# Patient Record
Sex: Female | Born: 1965 | Race: White | Hispanic: No | Marital: Married | Smoking: Former smoker
Health system: Southern US, Community
[De-identification: ages and names within clinical notes are randomized; demographics above are authoritative.]

## PROBLEM LIST (undated history)

## (undated) DIAGNOSIS — F329 Major depressive disorder, single episode, unspecified: Secondary | ICD-10-CM

## (undated) DIAGNOSIS — F32A Depression, unspecified: Secondary | ICD-10-CM

## (undated) DIAGNOSIS — E0500 Thyrotoxicosis with diffuse goiter without thyrotoxic crisis or storm: Secondary | ICD-10-CM

## (undated) DIAGNOSIS — F419 Anxiety disorder, unspecified: Secondary | ICD-10-CM

## (undated) HISTORY — PX: LITHOTRIPSY: SUR834

## (undated) HISTORY — PX: OTHER SURGICAL HISTORY: SHX169

## (undated) HISTORY — PX: FOOT SURGERY: SHX648

## (undated) HISTORY — DX: Thyrotoxicosis with diffuse goiter without thyrotoxic crisis or storm: E05.00

---

## 1998-04-10 ENCOUNTER — Encounter: Payer: Self-pay | Admitting: Endocrinology

## 1998-04-10 ENCOUNTER — Ambulatory Visit (HOSPITAL_COMMUNITY): Admission: RE | Admit: 1998-04-10 | Discharge: 1998-04-10 | Payer: Self-pay | Admitting: Endocrinology

## 1998-10-10 ENCOUNTER — Other Ambulatory Visit: Admission: RE | Admit: 1998-10-10 | Discharge: 1998-10-10 | Payer: Self-pay | Admitting: Gynecology

## 1999-11-07 ENCOUNTER — Other Ambulatory Visit: Admission: RE | Admit: 1999-11-07 | Discharge: 1999-11-07 | Payer: Self-pay | Admitting: Gynecology

## 2001-02-26 ENCOUNTER — Other Ambulatory Visit: Admission: RE | Admit: 2001-02-26 | Discharge: 2001-02-26 | Payer: Self-pay | Admitting: Gynecology

## 2002-03-26 ENCOUNTER — Other Ambulatory Visit: Admission: RE | Admit: 2002-03-26 | Discharge: 2002-03-26 | Payer: Self-pay | Admitting: Gynecology

## 2003-04-21 ENCOUNTER — Other Ambulatory Visit: Admission: RE | Admit: 2003-04-21 | Discharge: 2003-04-21 | Payer: Self-pay | Admitting: Gynecology

## 2004-10-23 ENCOUNTER — Other Ambulatory Visit: Admission: RE | Admit: 2004-10-23 | Discharge: 2004-10-23 | Payer: Self-pay | Admitting: Gynecology

## 2006-07-28 ENCOUNTER — Ambulatory Visit: Payer: Self-pay | Admitting: Internal Medicine

## 2007-04-01 ENCOUNTER — Ambulatory Visit: Payer: Self-pay | Admitting: Internal Medicine

## 2007-08-11 ENCOUNTER — Other Ambulatory Visit: Payer: Self-pay

## 2007-08-11 ENCOUNTER — Emergency Department: Payer: Self-pay | Admitting: Emergency Medicine

## 2008-09-26 ENCOUNTER — Other Ambulatory Visit: Admission: RE | Admit: 2008-09-26 | Discharge: 2008-09-26 | Payer: Self-pay | Admitting: Gynecology

## 2008-09-26 ENCOUNTER — Encounter: Payer: Self-pay | Admitting: Gynecology

## 2008-09-26 ENCOUNTER — Ambulatory Visit: Payer: Self-pay | Admitting: Gynecology

## 2008-10-14 DIAGNOSIS — D239 Other benign neoplasm of skin, unspecified: Secondary | ICD-10-CM

## 2008-10-14 HISTORY — DX: Other benign neoplasm of skin, unspecified: D23.9

## 2009-01-09 ENCOUNTER — Ambulatory Visit: Payer: Self-pay | Admitting: Unknown Physician Specialty

## 2009-01-22 HISTORY — PX: SHOULDER SURGERY: SHX246

## 2009-04-08 ENCOUNTER — Ambulatory Visit: Payer: Self-pay

## 2009-06-09 ENCOUNTER — Ambulatory Visit: Payer: Self-pay | Admitting: Pain Medicine

## 2009-08-03 ENCOUNTER — Ambulatory Visit: Payer: Self-pay | Admitting: Pain Medicine

## 2009-08-08 ENCOUNTER — Ambulatory Visit: Payer: Self-pay | Admitting: Pain Medicine

## 2009-08-14 ENCOUNTER — Ambulatory Visit: Payer: Self-pay | Admitting: Unknown Physician Specialty

## 2009-08-22 ENCOUNTER — Ambulatory Visit: Payer: Self-pay | Admitting: Pain Medicine

## 2009-09-13 ENCOUNTER — Ambulatory Visit: Payer: Self-pay | Admitting: Gynecology

## 2009-12-21 ENCOUNTER — Ambulatory Visit: Payer: Self-pay | Admitting: Gynecology

## 2009-12-21 ENCOUNTER — Other Ambulatory Visit: Admission: RE | Admit: 2009-12-21 | Discharge: 2009-12-21 | Payer: Self-pay | Admitting: Gynecology

## 2010-01-02 ENCOUNTER — Ambulatory Visit (HOSPITAL_COMMUNITY): Admission: RE | Admit: 2010-01-02 | Discharge: 2010-01-02 | Payer: Self-pay | Admitting: Gynecology

## 2010-01-08 ENCOUNTER — Ambulatory Visit: Payer: Self-pay | Admitting: Gynecology

## 2010-06-05 ENCOUNTER — Ambulatory Visit: Payer: Self-pay | Admitting: Internal Medicine

## 2010-08-29 ENCOUNTER — Ambulatory Visit: Payer: Self-pay | Admitting: Podiatry

## 2010-09-13 ENCOUNTER — Ambulatory Visit: Payer: Self-pay | Admitting: Podiatry

## 2011-03-14 ENCOUNTER — Ambulatory Visit: Payer: Self-pay

## 2011-03-27 ENCOUNTER — Other Ambulatory Visit: Payer: Self-pay | Admitting: Gynecology

## 2011-03-27 DIAGNOSIS — Z1231 Encounter for screening mammogram for malignant neoplasm of breast: Secondary | ICD-10-CM

## 2011-04-10 ENCOUNTER — Ambulatory Visit: Payer: Self-pay | Admitting: Podiatry

## 2011-04-25 ENCOUNTER — Ambulatory Visit (HOSPITAL_COMMUNITY): Payer: Self-pay

## 2011-04-25 ENCOUNTER — Encounter: Payer: Self-pay | Admitting: Gynecology

## 2011-05-27 ENCOUNTER — Ambulatory Visit: Payer: Self-pay | Admitting: Urology

## 2011-06-04 ENCOUNTER — Ambulatory Visit: Payer: Self-pay | Admitting: Podiatry

## 2011-12-03 ENCOUNTER — Ambulatory Visit: Payer: Self-pay | Admitting: Podiatry

## 2011-12-05 ENCOUNTER — Ambulatory Visit: Payer: Self-pay

## 2011-12-16 ENCOUNTER — Ambulatory Visit: Payer: Self-pay

## 2012-06-25 ENCOUNTER — Ambulatory Visit: Payer: Self-pay

## 2013-02-02 ENCOUNTER — Ambulatory Visit: Payer: Self-pay

## 2013-05-07 ENCOUNTER — Ambulatory Visit: Payer: Self-pay

## 2015-04-20 ENCOUNTER — Ambulatory Visit
Admission: RE | Admit: 2015-04-20 | Discharge: 2015-04-20 | Disposition: A | Discharge status: Home / Self Care | Payer: BLUE CROSS/BLUE SHIELD | Source: Ambulatory Visit | Attending: Nurse Practitioner | Admitting: Nurse Practitioner

## 2015-04-20 ENCOUNTER — Other Ambulatory Visit: Payer: Self-pay | Admitting: Nurse Practitioner

## 2015-04-20 DIAGNOSIS — M542 Cervicalgia: Secondary | ICD-10-CM | POA: Insufficient documentation

## 2015-04-20 DIAGNOSIS — M545 Low back pain: Secondary | ICD-10-CM

## 2015-04-20 DIAGNOSIS — M546 Pain in thoracic spine: Secondary | ICD-10-CM

## 2015-04-20 DIAGNOSIS — M549 Dorsalgia, unspecified: Secondary | ICD-10-CM | POA: Insufficient documentation

## 2015-09-29 DIAGNOSIS — F4323 Adjustment disorder with mixed anxiety and depressed mood: Secondary | ICD-10-CM | POA: Diagnosis not present

## 2015-10-04 DIAGNOSIS — F4323 Adjustment disorder with mixed anxiety and depressed mood: Secondary | ICD-10-CM | POA: Diagnosis not present

## 2015-10-05 DIAGNOSIS — M5412 Radiculopathy, cervical region: Secondary | ICD-10-CM | POA: Diagnosis not present

## 2015-10-05 DIAGNOSIS — M5416 Radiculopathy, lumbar region: Secondary | ICD-10-CM | POA: Diagnosis not present

## 2015-10-05 DIAGNOSIS — M5030 Other cervical disc degeneration, unspecified cervical region: Secondary | ICD-10-CM | POA: Diagnosis not present

## 2015-10-05 DIAGNOSIS — M5136 Other intervertebral disc degeneration, lumbar region: Secondary | ICD-10-CM | POA: Diagnosis not present

## 2015-10-09 ENCOUNTER — Other Ambulatory Visit: Payer: Self-pay | Admitting: Physical Medicine and Rehabilitation

## 2015-10-09 DIAGNOSIS — M5416 Radiculopathy, lumbar region: Secondary | ICD-10-CM

## 2015-10-10 ENCOUNTER — Ambulatory Visit
Admission: RE | Admit: 2015-10-10 | Discharge: 2015-10-10 | Disposition: A | Discharge status: Home / Self Care | Payer: BLUE CROSS/BLUE SHIELD | Source: Ambulatory Visit | Attending: Physical Medicine and Rehabilitation | Admitting: Physical Medicine and Rehabilitation

## 2015-10-10 DIAGNOSIS — M5416 Radiculopathy, lumbar region: Secondary | ICD-10-CM | POA: Insufficient documentation

## 2015-10-10 DIAGNOSIS — Z885 Allergy status to narcotic agent status: Secondary | ICD-10-CM | POA: Insufficient documentation

## 2015-10-10 DIAGNOSIS — M47816 Spondylosis without myelopathy or radiculopathy, lumbar region: Secondary | ICD-10-CM | POA: Diagnosis not present

## 2015-10-13 DIAGNOSIS — F4323 Adjustment disorder with mixed anxiety and depressed mood: Secondary | ICD-10-CM | POA: Diagnosis not present

## 2015-10-18 DIAGNOSIS — F4323 Adjustment disorder with mixed anxiety and depressed mood: Secondary | ICD-10-CM | POA: Diagnosis not present

## 2015-10-19 DIAGNOSIS — M5116 Intervertebral disc disorders with radiculopathy, lumbar region: Secondary | ICD-10-CM | POA: Diagnosis not present

## 2015-10-19 DIAGNOSIS — F329 Major depressive disorder, single episode, unspecified: Secondary | ICD-10-CM | POA: Diagnosis not present

## 2015-10-19 DIAGNOSIS — F411 Generalized anxiety disorder: Secondary | ICD-10-CM | POA: Diagnosis not present

## 2015-10-19 DIAGNOSIS — G4700 Insomnia, unspecified: Secondary | ICD-10-CM | POA: Diagnosis not present

## 2015-10-24 DIAGNOSIS — F4323 Adjustment disorder with mixed anxiety and depressed mood: Secondary | ICD-10-CM | POA: Diagnosis not present

## 2015-11-01 DIAGNOSIS — M5136 Other intervertebral disc degeneration, lumbar region: Secondary | ICD-10-CM | POA: Diagnosis not present

## 2015-11-01 DIAGNOSIS — M5416 Radiculopathy, lumbar region: Secondary | ICD-10-CM | POA: Diagnosis not present

## 2015-11-13 DIAGNOSIS — L400 Psoriasis vulgaris: Secondary | ICD-10-CM | POA: Diagnosis not present

## 2015-11-14 DIAGNOSIS — F4323 Adjustment disorder with mixed anxiety and depressed mood: Secondary | ICD-10-CM | POA: Diagnosis not present

## 2015-11-21 DIAGNOSIS — F4323 Adjustment disorder with mixed anxiety and depressed mood: Secondary | ICD-10-CM | POA: Diagnosis not present

## 2015-12-12 DIAGNOSIS — F4323 Adjustment disorder with mixed anxiety and depressed mood: Secondary | ICD-10-CM | POA: Diagnosis not present

## 2015-12-19 DIAGNOSIS — F4323 Adjustment disorder with mixed anxiety and depressed mood: Secondary | ICD-10-CM | POA: Diagnosis not present

## 2016-01-08 DIAGNOSIS — F4323 Adjustment disorder with mixed anxiety and depressed mood: Secondary | ICD-10-CM | POA: Diagnosis not present

## 2016-01-19 DIAGNOSIS — F4323 Adjustment disorder with mixed anxiety and depressed mood: Secondary | ICD-10-CM | POA: Diagnosis not present

## 2016-01-23 DIAGNOSIS — F4323 Adjustment disorder with mixed anxiety and depressed mood: Secondary | ICD-10-CM | POA: Diagnosis not present

## 2016-01-31 DIAGNOSIS — F4323 Adjustment disorder with mixed anxiety and depressed mood: Secondary | ICD-10-CM | POA: Diagnosis not present

## 2016-02-03 DIAGNOSIS — N390 Urinary tract infection, site not specified: Secondary | ICD-10-CM | POA: Diagnosis not present

## 2016-02-03 DIAGNOSIS — R399 Unspecified symptoms and signs involving the genitourinary system: Secondary | ICD-10-CM | POA: Diagnosis not present

## 2016-02-14 DIAGNOSIS — L400 Psoriasis vulgaris: Secondary | ICD-10-CM | POA: Diagnosis not present

## 2016-02-14 DIAGNOSIS — D485 Neoplasm of uncertain behavior of skin: Secondary | ICD-10-CM | POA: Diagnosis not present

## 2016-02-14 DIAGNOSIS — D229 Melanocytic nevi, unspecified: Secondary | ICD-10-CM | POA: Diagnosis not present

## 2016-02-14 DIAGNOSIS — Z1283 Encounter for screening for malignant neoplasm of skin: Secondary | ICD-10-CM | POA: Diagnosis not present

## 2016-02-14 DIAGNOSIS — F4323 Adjustment disorder with mixed anxiety and depressed mood: Secondary | ICD-10-CM | POA: Diagnosis not present

## 2016-02-15 DIAGNOSIS — F411 Generalized anxiety disorder: Secondary | ICD-10-CM | POA: Diagnosis not present

## 2016-02-15 DIAGNOSIS — F329 Major depressive disorder, single episode, unspecified: Secondary | ICD-10-CM | POA: Diagnosis not present

## 2016-02-15 DIAGNOSIS — G4700 Insomnia, unspecified: Secondary | ICD-10-CM | POA: Diagnosis not present

## 2016-02-15 DIAGNOSIS — N390 Urinary tract infection, site not specified: Secondary | ICD-10-CM | POA: Diagnosis not present

## 2016-02-27 DIAGNOSIS — F4323 Adjustment disorder with mixed anxiety and depressed mood: Secondary | ICD-10-CM | POA: Diagnosis not present

## 2016-03-08 DIAGNOSIS — F4323 Adjustment disorder with mixed anxiety and depressed mood: Secondary | ICD-10-CM | POA: Diagnosis not present

## 2016-03-22 DIAGNOSIS — L400 Psoriasis vulgaris: Secondary | ICD-10-CM | POA: Diagnosis not present

## 2016-03-25 DIAGNOSIS — F4323 Adjustment disorder with mixed anxiety and depressed mood: Secondary | ICD-10-CM | POA: Diagnosis not present

## 2016-04-10 DIAGNOSIS — F4323 Adjustment disorder with mixed anxiety and depressed mood: Secondary | ICD-10-CM | POA: Diagnosis not present

## 2016-04-18 DIAGNOSIS — Z23 Encounter for immunization: Secondary | ICD-10-CM | POA: Diagnosis not present

## 2016-05-03 DIAGNOSIS — F4323 Adjustment disorder with mixed anxiety and depressed mood: Secondary | ICD-10-CM | POA: Diagnosis not present

## 2016-05-12 DIAGNOSIS — K121 Other forms of stomatitis: Secondary | ICD-10-CM | POA: Diagnosis not present

## 2016-05-20 DIAGNOSIS — F4323 Adjustment disorder with mixed anxiety and depressed mood: Secondary | ICD-10-CM | POA: Diagnosis not present

## 2016-05-21 ENCOUNTER — Other Ambulatory Visit: Payer: Self-pay | Admitting: Internal Medicine

## 2016-05-21 DIAGNOSIS — Z1231 Encounter for screening mammogram for malignant neoplasm of breast: Secondary | ICD-10-CM

## 2016-05-21 DIAGNOSIS — Z124 Encounter for screening for malignant neoplasm of cervix: Secondary | ICD-10-CM | POA: Diagnosis not present

## 2016-05-21 DIAGNOSIS — F411 Generalized anxiety disorder: Secondary | ICD-10-CM | POA: Diagnosis not present

## 2016-05-21 DIAGNOSIS — M5117 Intervertebral disc disorders with radiculopathy, lumbosacral region: Secondary | ICD-10-CM | POA: Diagnosis not present

## 2016-05-21 DIAGNOSIS — L409 Psoriasis, unspecified: Secondary | ICD-10-CM | POA: Diagnosis not present

## 2016-05-21 DIAGNOSIS — Z0001 Encounter for general adult medical examination with abnormal findings: Secondary | ICD-10-CM | POA: Diagnosis not present

## 2016-05-23 DIAGNOSIS — M5416 Radiculopathy, lumbar region: Secondary | ICD-10-CM | POA: Diagnosis not present

## 2016-05-23 DIAGNOSIS — M5136 Other intervertebral disc degeneration, lumbar region: Secondary | ICD-10-CM | POA: Diagnosis not present

## 2016-06-11 DIAGNOSIS — F4323 Adjustment disorder with mixed anxiety and depressed mood: Secondary | ICD-10-CM | POA: Diagnosis not present

## 2016-06-27 ENCOUNTER — Ambulatory Visit
Admission: RE | Admit: 2016-06-27 | Discharge: 2016-06-27 | Disposition: A | Discharge status: Home / Self Care | Payer: BLUE CROSS/BLUE SHIELD | Source: Ambulatory Visit | Attending: Internal Medicine | Admitting: Internal Medicine

## 2016-06-27 DIAGNOSIS — Z1231 Encounter for screening mammogram for malignant neoplasm of breast: Secondary | ICD-10-CM

## 2016-06-27 DIAGNOSIS — R928 Other abnormal and inconclusive findings on diagnostic imaging of breast: Secondary | ICD-10-CM | POA: Diagnosis present

## 2016-06-28 ENCOUNTER — Other Ambulatory Visit: Payer: Self-pay | Admitting: Nurse Practitioner

## 2016-06-28 DIAGNOSIS — R928 Other abnormal and inconclusive findings on diagnostic imaging of breast: Secondary | ICD-10-CM

## 2016-06-28 DIAGNOSIS — N6489 Other specified disorders of breast: Secondary | ICD-10-CM

## 2016-07-04 ENCOUNTER — Ambulatory Visit
Admission: RE | Admit: 2016-07-04 | Discharge: 2016-07-04 | Disposition: A | Discharge status: Home / Self Care | Payer: BLUE CROSS/BLUE SHIELD | Source: Ambulatory Visit | Attending: Nurse Practitioner | Admitting: Nurse Practitioner

## 2016-07-04 DIAGNOSIS — R928 Other abnormal and inconclusive findings on diagnostic imaging of breast: Secondary | ICD-10-CM

## 2016-07-04 DIAGNOSIS — N6489 Other specified disorders of breast: Secondary | ICD-10-CM | POA: Insufficient documentation

## 2016-07-04 DIAGNOSIS — R922 Inconclusive mammogram: Secondary | ICD-10-CM | POA: Diagnosis not present

## 2016-07-15 DIAGNOSIS — F4323 Adjustment disorder with mixed anxiety and depressed mood: Secondary | ICD-10-CM | POA: Diagnosis not present

## 2016-07-30 DIAGNOSIS — F4322 Adjustment disorder with anxiety: Secondary | ICD-10-CM | POA: Diagnosis not present

## 2016-08-02 DIAGNOSIS — J111 Influenza due to unidentified influenza virus with other respiratory manifestations: Secondary | ICD-10-CM | POA: Diagnosis not present

## 2016-08-02 DIAGNOSIS — R05 Cough: Secondary | ICD-10-CM | POA: Diagnosis not present

## 2016-08-27 DIAGNOSIS — F4322 Adjustment disorder with anxiety: Secondary | ICD-10-CM | POA: Diagnosis not present

## 2016-09-17 DIAGNOSIS — L299 Pruritus, unspecified: Secondary | ICD-10-CM | POA: Diagnosis not present

## 2016-09-17 DIAGNOSIS — L2089 Other atopic dermatitis: Secondary | ICD-10-CM | POA: Diagnosis not present

## 2016-09-17 DIAGNOSIS — L400 Psoriasis vulgaris: Secondary | ICD-10-CM | POA: Diagnosis not present

## 2016-09-17 DIAGNOSIS — L821 Other seborrheic keratosis: Secondary | ICD-10-CM | POA: Diagnosis not present

## 2016-09-19 DIAGNOSIS — B0230 Zoster ocular disease, unspecified: Secondary | ICD-10-CM | POA: Diagnosis not present

## 2016-09-19 DIAGNOSIS — N390 Urinary tract infection, site not specified: Secondary | ICD-10-CM | POA: Diagnosis not present

## 2016-09-19 DIAGNOSIS — L298 Other pruritus: Secondary | ICD-10-CM | POA: Diagnosis not present

## 2016-09-19 DIAGNOSIS — R300 Dysuria: Secondary | ICD-10-CM | POA: Diagnosis not present

## 2016-09-19 DIAGNOSIS — N771 Vaginitis, vulvitis and vulvovaginitis in diseases classified elsewhere: Secondary | ICD-10-CM | POA: Diagnosis not present

## 2016-10-01 DIAGNOSIS — F4322 Adjustment disorder with anxiety: Secondary | ICD-10-CM | POA: Diagnosis not present

## 2017-01-14 DIAGNOSIS — L400 Psoriasis vulgaris: Secondary | ICD-10-CM | POA: Diagnosis not present

## 2017-03-05 DIAGNOSIS — G4700 Insomnia, unspecified: Secondary | ICD-10-CM | POA: Diagnosis not present

## 2017-03-05 DIAGNOSIS — M50122 Cervical disc disorder at C5-C6 level with radiculopathy: Secondary | ICD-10-CM | POA: Diagnosis not present

## 2017-03-05 DIAGNOSIS — M064 Inflammatory polyarthropathy: Secondary | ICD-10-CM | POA: Diagnosis not present

## 2017-03-05 DIAGNOSIS — R10816 Epigastric abdominal tenderness: Secondary | ICD-10-CM | POA: Diagnosis not present

## 2017-03-06 ENCOUNTER — Other Ambulatory Visit: Payer: Self-pay | Admitting: Nurse Practitioner

## 2017-03-06 ENCOUNTER — Ambulatory Visit
Admission: RE | Admit: 2017-03-06 | Discharge: 2017-03-06 | Disposition: A | Discharge status: Home / Self Care | Payer: BLUE CROSS/BLUE SHIELD | Source: Ambulatory Visit | Attending: Nurse Practitioner | Admitting: Nurse Practitioner

## 2017-03-06 DIAGNOSIS — M542 Cervicalgia: Secondary | ICD-10-CM | POA: Insufficient documentation

## 2017-03-06 DIAGNOSIS — M069 Rheumatoid arthritis, unspecified: Secondary | ICD-10-CM | POA: Diagnosis not present

## 2017-03-06 DIAGNOSIS — E039 Hypothyroidism, unspecified: Secondary | ICD-10-CM | POA: Diagnosis not present

## 2017-03-06 DIAGNOSIS — R52 Pain, unspecified: Secondary | ICD-10-CM

## 2017-03-06 DIAGNOSIS — Z0001 Encounter for general adult medical examination with abnormal findings: Secondary | ICD-10-CM | POA: Diagnosis not present

## 2017-03-06 DIAGNOSIS — R10816 Epigastric abdominal tenderness: Secondary | ICD-10-CM | POA: Diagnosis not present

## 2017-03-06 DIAGNOSIS — M4802 Spinal stenosis, cervical region: Secondary | ICD-10-CM | POA: Diagnosis not present

## 2017-03-18 DIAGNOSIS — L400 Psoriasis vulgaris: Secondary | ICD-10-CM | POA: Diagnosis not present

## 2017-03-18 DIAGNOSIS — D229 Melanocytic nevi, unspecified: Secondary | ICD-10-CM | POA: Diagnosis not present

## 2017-03-18 DIAGNOSIS — Z1283 Encounter for screening for malignant neoplasm of skin: Secondary | ICD-10-CM | POA: Diagnosis not present

## 2017-03-18 DIAGNOSIS — D485 Neoplasm of uncertain behavior of skin: Secondary | ICD-10-CM | POA: Diagnosis not present

## 2017-03-18 DIAGNOSIS — L570 Actinic keratosis: Secondary | ICD-10-CM | POA: Diagnosis not present

## 2017-03-24 DIAGNOSIS — R10816 Epigastric abdominal tenderness: Secondary | ICD-10-CM | POA: Diagnosis not present

## 2017-03-27 DIAGNOSIS — J069 Acute upper respiratory infection, unspecified: Secondary | ICD-10-CM | POA: Diagnosis not present

## 2017-04-11 DIAGNOSIS — F329 Major depressive disorder, single episode, unspecified: Secondary | ICD-10-CM | POA: Diagnosis not present

## 2017-04-11 DIAGNOSIS — M064 Inflammatory polyarthropathy: Secondary | ICD-10-CM | POA: Diagnosis not present

## 2017-04-11 DIAGNOSIS — M50122 Cervical disc disorder at C5-C6 level with radiculopathy: Secondary | ICD-10-CM | POA: Diagnosis not present

## 2017-04-11 DIAGNOSIS — R10816 Epigastric abdominal tenderness: Secondary | ICD-10-CM | POA: Diagnosis not present

## 2017-06-26 DIAGNOSIS — M5116 Intervertebral disc disorders with radiculopathy, lumbar region: Secondary | ICD-10-CM | POA: Diagnosis not present

## 2017-06-26 DIAGNOSIS — L409 Psoriasis, unspecified: Secondary | ICD-10-CM | POA: Diagnosis not present

## 2017-06-26 DIAGNOSIS — E049 Nontoxic goiter, unspecified: Secondary | ICD-10-CM | POA: Insufficient documentation

## 2017-06-26 DIAGNOSIS — M2550 Pain in unspecified joint: Secondary | ICD-10-CM | POA: Diagnosis not present

## 2017-06-26 DIAGNOSIS — M50122 Cervical disc disorder at C5-C6 level with radiculopathy: Secondary | ICD-10-CM | POA: Diagnosis not present

## 2017-06-26 DIAGNOSIS — Z1231 Encounter for screening mammogram for malignant neoplasm of breast: Secondary | ICD-10-CM | POA: Insufficient documentation

## 2017-06-26 DIAGNOSIS — M130 Polyarthritis, unspecified: Secondary | ICD-10-CM | POA: Diagnosis not present

## 2017-06-26 DIAGNOSIS — Z79899 Other long term (current) drug therapy: Secondary | ICD-10-CM | POA: Insufficient documentation

## 2017-06-26 DIAGNOSIS — Z13820 Encounter for screening for osteoporosis: Secondary | ICD-10-CM | POA: Diagnosis not present

## 2017-07-03 ENCOUNTER — Other Ambulatory Visit: Payer: Self-pay

## 2017-07-04 ENCOUNTER — Other Ambulatory Visit: Payer: Self-pay | Admitting: Nurse Practitioner

## 2017-07-04 DIAGNOSIS — F5101 Primary insomnia: Secondary | ICD-10-CM

## 2017-07-04 MED ORDER — ZOLPIDEM TARTRATE 10 MG PO TABS: 10.0000 mg | ORAL_TABLET | Freq: Every evening | ORAL | 2 refills | Status: DC | PRN

## 2017-07-04 NOTE — Progress Notes (Signed)
Sent in new rx for [facility]ien [facility] with 2 refills

## 2017-07-21 DIAGNOSIS — M9902 Segmental and somatic dysfunction of thoracic region: Secondary | ICD-10-CM | POA: Diagnosis not present

## 2017-07-21 DIAGNOSIS — M546 Pain in thoracic spine: Secondary | ICD-10-CM | POA: Diagnosis not present

## 2017-07-21 DIAGNOSIS — M9901 Segmental and somatic dysfunction of cervical region: Secondary | ICD-10-CM | POA: Diagnosis not present

## 2017-07-21 DIAGNOSIS — M9903 Segmental and somatic dysfunction of lumbar region: Secondary | ICD-10-CM | POA: Diagnosis not present

## 2017-07-29 ENCOUNTER — Ambulatory Visit: Payer: BLUE CROSS/BLUE SHIELD | Admitting: Nurse Practitioner

## 2017-07-29 ENCOUNTER — Encounter: Payer: Self-pay | Admitting: Nurse Practitioner

## 2017-07-29 VITALS — BP 98/67 | HR 71 | Resp 16 | Ht 64.0 in | Wt 150.0 lb

## 2017-07-29 DIAGNOSIS — F5101 Primary insomnia: Secondary | ICD-10-CM

## 2017-07-29 DIAGNOSIS — Z1231 Encounter for screening mammogram for malignant neoplasm of breast: Secondary | ICD-10-CM | POA: Diagnosis not present

## 2017-07-29 DIAGNOSIS — B372 Candidiasis of skin and nail: Secondary | ICD-10-CM

## 2017-07-29 DIAGNOSIS — Z1239 Encounter for other screening for malignant neoplasm of breast: Secondary | ICD-10-CM

## 2017-07-29 MED ORDER — ZOLPIDEM TARTRATE 10 MG PO TABS
10.0000 mg | ORAL_TABLET | Freq: Every evening | ORAL | 3 refills | Status: DC | PRN
Start: 2017-07-29 — End: 2017-11-28

## 2017-07-29 MED ORDER — CLOTRIMAZOLE-BETAMETHASONE 1-0.05 % EX CREA: 1.0000 "application " | TOPICAL_CREAM | Freq: Two times a day (BID) | CUTANEOUS | 1 refills | Status: DC

## 2017-07-29 NOTE — Progress Notes (Addendum)
[facility] [place], [place] [address]  Internal MEDICINE  Office Visit Note  Patient Name: Holly Le  [id number]  [id number]  Date of Service: 07/29/2017  Chief Complaint  Patient presents with  . Rash    burning in rectal area going on for past few days.    Rash  This is a new problem. The current episode started 1 to 4 weeks ago. The problem has been gradually worsening since onset. Location: around rectum. The rash is characterized by burning and redness. She was exposed to nothing. Pertinent negatives include no congestion, cough, diarrhea, fatigue, rhinorrhea, shortness of breath, sore throat or vomiting. Past treatments include anti-itch cream and cold compress. The treatment provided no relief.    Pt is here for routine follow up.    Current Medication: Outpatient Encounter Medications as of 07/29/2017  Medication Sig  . ALPRAZolam (XANAX) 0.5 MG tablet Take by mouth.  . Apremilast 30 MG TABS Take by mouth.  . citalopram (CELEXA) 10 MG tablet Take 2 tab po daily  . tiZANidine (ZANAFLEX) 4 MG tablet Take by mouth.  . clotrimazole-betamethasone (LOTRISONE) cream Apply 1 application topically 2 (two) times daily.  Marland Kitchen zolpidem (AMBIEN) 10 MG tablet Take 1 tablet (10 mg total) by mouth at bedtime as needed for sleep.  . [DISCONTINUED] zolpidem (AMBIEN) 10 MG tablet Take 1 tablet (10 mg total) by mouth at bedtime as needed for sleep.   No facility-administered encounter medications on file as of 07/29/2017.     Surgical History: Past Surgical History:  Procedure Laterality Date  . CESAREAN SECTION     twins  . LITHOTRIPSY    . SHOULDER SURGERY  01/2009   RIGHT    Medical History: Past Medical History:  Diagnosis Date  . Grave's disease     Family History: Family History  Problem Relation Age of Onset  . Lymphoma Father   . Lymphoma Paternal Aunt   . Cancer Maternal Grandfather        COLON    Social History   Socioeconomic  History  . Marital status: Married    Spouse name: Not on file  . Number of children: Not on file  . Years of education: Not on file  . Highest education level: Not on file  Social Needs  . Financial resource strain: Not on file  . Food insecurity - worry: Not on file  . Food insecurity - inability: Not on file  . Transportation needs - medical: Not on file  . Transportation needs - non-medical: Not on file  Occupational History  . Not on file  Tobacco Use  . Smoking status: Never Smoker  . Smokeless tobacco: Never Used  Substance and Sexual Activity  . Alcohol use: Yes  . Drug use: No  . Sexual activity: Not on file  Other Topics Concern  . Not on file  Social History Narrative  . Not on file      Review of Systems  Constitutional: Negative for chills, fatigue and unexpected weight change.  HENT: Negative for congestion, postnasal drip, rhinorrhea, sneezing and sore throat.   Eyes: Negative for redness.  Respiratory: Negative for cough, chest tightness, shortness of breath and wheezing.   Cardiovascular: Negative for chest pain and palpitations.  Gastrointestinal: Positive for constipation. Negative for abdominal pain, diarrhea, nausea and vomiting.  Endocrine: Negative for cold intolerance, heat intolerance, polydipsia, polyphagia and polyuria.  Genitourinary: Negative for dysuria and frequency.  Has frequent yeast infection.  Musculoskeletal: Positive for arthralgias. Negative for back pain, joint swelling and neck pain.  Skin: Positive for rash.       Rash is around rectal area. Patient states that this " is lighting her up."  Neurological: Negative for tremors, numbness and headaches.  Hematological: Negative for adenopathy. Does not bruise/bleed easily.  Psychiatric/Behavioral: Positive for sleep disturbance. Negative for behavioral problems (Depression) and suicidal ideas. The patient is not nervous/anxious.        Takes zolpidem 10mg  at night when needed for  insomnia. She does need to have refill today    Today's Vitals   07/29/17 0857  BP: 98/67  Pulse: 71  Resp: 16  SpO2: 96%  Weight: 150 lb (68 kg)  Height: 5\' 4"  (1.626 m)    Physical Exam  Constitutional: She is oriented to person, place, and time. She appears well-developed and well-nourished. No distress.  HENT:  Head: Normocephalic and atraumatic.  Mouth/Throat: Oropharynx is clear and moist. No oropharyngeal exudate.  Eyes: EOM are normal. Pupils are equal, round, and reactive to light.  Neck: Normal range of motion. Neck supple. No JVD present. No tracheal deviation present. No thyromegaly present.  Cardiovascular: Normal rate, regular rhythm and normal heart sounds. Exam reveals no gallop and no friction rub.  No murmur heard. Pulmonary/Chest: Effort normal and breath sounds normal. No respiratory distress. She has no wheezes. She has no rales. She exhibits no tenderness.  Abdominal: Soft. Bowel sounds are normal. There is no tenderness.  Genitourinary:     Musculoskeletal: Normal range of motion.  Lymphadenopathy:    She has no cervical adenopathy.  Neurological: She is alert and oriented to person, place, and time. No cranial nerve deficit.  Skin: Skin is warm and dry. She is not diaphoretic.  Psychiatric: She has a normal mood and affect. Her behavior is normal. Judgment and thought content normal.  Nursing note and vitals reviewed.   Assessment/Plan: 1. Primary insomnia Stable. May continue zolpidem as needed and as prescribed.  - zolpidem (AMBIEN) 10 MG tablet; Take 1 tablet (10 mg total) by mouth at bedtime as needed for sleep.  Dispense: 30 tablet; Refill: 3  2. Candidiasis of skin Directly around rectum/anus. - clotrimazole-betamethasone (LOTRISONE) cream; Apply 1 application topically 2 (two) times daily.  Dispense: 45 g; Refill: 1  3. Screening for breast cancer - MM DIGITAL SCREENING BILATERAL; Future  General Counseling: olivette beckmann  understanding of the findings of todays visit and agrees with plan of treatment. I have discussed any further diagnostic evaluation that may be needed or ordered today. We also reviewed her medications today. she has been encouraged to call the office with any questions or concerns that should arise related to todays visit.  This patient was seen by Leretha Pol, FNP- C in Collaboration with Dr Lavera Guise as a part of collaborative care agreement      Time spent: 36 Minutes          Dr Lavera Guise Internal medicine

## 2017-08-12 ENCOUNTER — Ambulatory Visit: Payer: BLUE CROSS/BLUE SHIELD | Attending: Nurse Practitioner

## 2017-09-15 DIAGNOSIS — L400 Psoriasis vulgaris: Secondary | ICD-10-CM | POA: Diagnosis not present

## 2017-09-15 DIAGNOSIS — L858 Other specified epidermal thickening: Secondary | ICD-10-CM | POA: Diagnosis not present

## 2017-09-15 DIAGNOSIS — L739 Follicular disorder, unspecified: Secondary | ICD-10-CM | POA: Diagnosis not present

## 2017-11-28 ENCOUNTER — Ambulatory Visit (INDEPENDENT_AMBULATORY_CARE_PROVIDER_SITE_OTHER): Payer: BLUE CROSS/BLUE SHIELD | Admitting: Nurse Practitioner

## 2017-11-28 ENCOUNTER — Encounter: Payer: Self-pay | Admitting: Nurse Practitioner

## 2017-11-28 VITALS — BP 98/70 | HR 64 | Resp 16 | Ht 64.0 in | Wt 146.6 lb

## 2017-11-28 DIAGNOSIS — R300 Dysuria: Secondary | ICD-10-CM

## 2017-11-28 DIAGNOSIS — Z1231 Encounter for screening mammogram for malignant neoplasm of breast: Secondary | ICD-10-CM | POA: Diagnosis not present

## 2017-11-28 DIAGNOSIS — F5101 Primary insomnia: Secondary | ICD-10-CM

## 2017-11-28 DIAGNOSIS — B379 Candidiasis, unspecified: Secondary | ICD-10-CM

## 2017-11-28 DIAGNOSIS — Z1239 Encounter for other screening for malignant neoplasm of breast: Secondary | ICD-10-CM

## 2017-11-28 DIAGNOSIS — Z0001 Encounter for general adult medical examination with abnormal findings: Secondary | ICD-10-CM | POA: Diagnosis not present

## 2017-11-28 MED ORDER — ZOLPIDEM TARTRATE 10 MG PO TABS: 10.0000 mg | ORAL_TABLET | Freq: Every evening | ORAL | 3 refills | Status: DC | PRN

## 2017-11-28 MED ORDER — FLUCONAZOLE 150 MG PO TABS: ORAL_TABLET | ORAL | 1 refills | Status: DC

## 2017-11-28 NOTE — Progress Notes (Signed)
[facility] [place], [place] [address]  Internal MEDICINE  Office Visit Note  Patient Name: Holly Le  [id number]  [id number]  Date of Service: 12/17/2017   Pt is here for routine health maintenance examination.  Chief Complaint  Patient presents with  . Annual Exam  . Vaginitis    prescribed creams not helping     The patient is c/o vaginal discharge. This is white in color. Has no odor. Having vaginal itching and irritation. Has been using OTC preparations to treat yeast infection without relief.     Current Medication: Outpatient Encounter Medications as of 11/28/2017  Medication Sig  . ALPRAZolam (XANAX) 0.5 MG tablet Take by mouth.  . Apremilast 30 MG TABS Take by mouth.  . citalopram (CELEXA) 10 MG tablet Take 2 tab po daily  . clotrimazole-betamethasone (LOTRISONE) cream Apply 1 application topically 2 (two) times daily.  Marland Kitchen tiZANidine (ZANAFLEX) 4 MG tablet Take by mouth.  . zolpidem (AMBIEN) 10 MG tablet Take 1 tablet (10 mg total) by mouth at bedtime as needed for sleep.  . [DISCONTINUED] zolpidem (AMBIEN) 10 MG tablet Take 1 tablet (10 mg total) by mouth at bedtime as needed for sleep.  . fluconazole (DIFLUCAN) 150 MG tablet Take 1 tablet po once. May repeat dose in 3 days as needed for persistent symptoms.   No facility-administered encounter medications on file as of 11/28/2017.     Surgical History: Past Surgical History:  Procedure Laterality Date  . CESAREAN SECTION     twins  . LITHOTRIPSY    . SHOULDER SURGERY  01/2009   RIGHT    Medical History: Past Medical History:  Diagnosis Date  . Grave's disease     Family History: Family History  Problem Relation Age of Onset  . Lymphoma Father   . Lymphoma Paternal Aunt   . Cancer Maternal Grandfather        COLON      Review of Systems  Constitutional: Negative for chills, fatigue and unexpected weight change.  HENT: Negative for congestion, postnasal drip,  rhinorrhea, sneezing and sore throat.   Eyes: Negative.  Negative for redness.  Respiratory: Negative for cough, chest tightness, shortness of breath and wheezing.   Cardiovascular: Negative for chest pain and palpitations.  Gastrointestinal: Positive for constipation. Negative for abdominal pain, diarrhea, nausea and vomiting.  Endocrine: Negative for cold intolerance, heat intolerance, polydipsia, polyphagia and polyuria.  Genitourinary: Negative for dysuria and frequency.       Has frequent yeast infection. Has been using OTC preparations and has had no relief.   Musculoskeletal: Positive for arthralgias. Negative for back pain, joint swelling and neck pain.  Skin: Positive for rash.       Rash is around rectal area. Patient states that this " is lighting her up."  Allergic/Immunologic: Positive for environmental allergies.  Neurological: Negative for dizziness, tremors, numbness and headaches.  Hematological: Negative for adenopathy. Does not bruise/bleed easily.  Psychiatric/Behavioral: Positive for sleep disturbance. Negative for behavioral problems (Depression) and suicidal ideas. The patient is not nervous/anxious.        Takes zolpidem 10mg  at night when needed for insomnia. She does need to have refill today     Today's Vitals   11/28/17 1004  BP: 98/70  Pulse: 64  Resp: 16  SpO2: 99%  Weight: 146 lb 9.6 oz (66.5 kg)  Height: 5\' 4"  (1.626 m)     Physical Exam  Constitutional: She is oriented to person, place,  and time. She appears well-developed and well-nourished. No distress.  HENT:  Head: Normocephalic and atraumatic.  Nose: Nose normal.  Mouth/Throat: Oropharynx is clear and moist. No oropharyngeal exudate.  Eyes: Pupils are equal, round, and reactive to light. Conjunctivae and EOM are normal.  Neck: Normal range of motion. Neck supple. No JVD present. Carotid bruit is not present. No tracheal deviation present. No thyromegaly present.  Cardiovascular: Normal rate,  regular rhythm, normal heart sounds and intact distal pulses. Exam reveals no gallop and no friction rub.  No murmur heard. Pulmonary/Chest: Effort normal and breath sounds normal. No respiratory distress. She has no wheezes. She has no rales. She exhibits no tenderness. Right breast exhibits no inverted nipple, no mass, no nipple discharge, no skin change and no tenderness. Left breast exhibits no inverted nipple, no mass, no nipple discharge, no skin change and no tenderness.  Abdominal: Soft. Bowel sounds are normal. There is no tenderness.  Musculoskeletal: Normal range of motion.  Lymphadenopathy:    She has no cervical adenopathy.  Neurological: She is alert and oriented to person, place, and time. No cranial nerve deficit.  Skin: Skin is warm and dry. She is not diaphoretic.  Psychiatric: She has a normal mood and affect. Her behavior is normal. Judgment and thought content normal.  Nursing note and vitals reviewed.  Assessment/Plan: 1. Encounter for general adult medical examination with abnormal findings Annual wellness visit today.   2. Candidosis Add back diflucan 150mg  tablets. Take once then repeat in three days as needed for persistent symptoms.  - fluconazole (DIFLUCAN) 150 MG tablet; Take 1 tablet po once. May repeat dose in 3 days as needed for persistent symptoms.  Dispense: 3 tablet; Refill: 1  3. Primary insomnia Continue zolpidem 10mg  at bedtime as needed. New prescription sent today.  - zolpidem (AMBIEN) 10 MG tablet; Take 1 tablet (10 mg total) by mouth at bedtime as needed for sleep.  Dispense: 30 tablet; Refill: 3  4. Dysuria - Urinalysis, Routine w reflex microscopic  5. Screening for breast cancer - MM DIGITAL SCREENING BILATERAL; Future  General Counseling: meliza kage understanding of the findings of todays visit and agrees with plan of treatment. I have discussed any further diagnostic evaluation that may be needed or ordered today. We also reviewed  her medications today. she has been encouraged to call the office with any questions or concerns that should arise related to todays visit.    Counseling:  This patient was seen by Leretha Pol, FNP- C in Collaboration with Dr Lavera Guise as a part of collaborative care agreement  Orders Placed This Encounter  Procedures  . MM DIGITAL SCREENING BILATERAL  . Urinalysis, Routine w reflex microscopic    Meds ordered this encounter  Medications  . zolpidem (AMBIEN) 10 MG tablet    Sig: Take 1 tablet (10 mg total) by mouth at bedtime as needed for sleep.    Dispense:  30 tablet    Refill:  3    Order Specific Question:   Supervising Provider    Answer:   Lavera Guise [[id number]]  . fluconazole (DIFLUCAN) 150 MG tablet    Sig: Take 1 tablet po once. May repeat dose in 3 days as needed for persistent symptoms.    Dispense:  3 tablet    Refill:  1    Order Specific Question:   Supervising Provider    Answer:   Lavera Guise [[id number]]    Time spent: 30 Minutes  Lavera Guise, MD  Internal Medicine

## 2017-11-29 LAB — URINALYSIS, ROUTINE W REFLEX MICROSCOPIC
BILIRUBIN UA: NEGATIVE
Glucose, UA: NEGATIVE
Ketones, UA: NEGATIVE
Leukocytes, UA: NEGATIVE
NITRITE UA: NEGATIVE
PH UA: 5.5 (ref 5.0–7.5)
PROTEIN UA: NEGATIVE
RBC UA: NEGATIVE
Specific Gravity, UA: 1.012 (ref 1.005–1.030)
UUROB: 0.2 mg/dL (ref 0.2–1.0)

## 2017-12-17 ENCOUNTER — Other Ambulatory Visit: Payer: Self-pay | Admitting: Internal Medicine

## 2017-12-17 DIAGNOSIS — F5101 Primary insomnia: Secondary | ICD-10-CM | POA: Insufficient documentation

## 2017-12-17 DIAGNOSIS — R300 Dysuria: Secondary | ICD-10-CM | POA: Insufficient documentation

## 2017-12-17 DIAGNOSIS — B379 Candidiasis, unspecified: Secondary | ICD-10-CM | POA: Insufficient documentation

## 2018-01-13 IMAGING — MR MR LUMBAR SPINE W/O CM
4 of 5 series · 15 of 48 positions shown · non-contrast
Comparison: 04/20/2015 plain film examination.  10/24/2009 MR.

CLINICAL DATA: 49-year-old female with low back pain extending to
right buttock. No leg pain and numbness. No known injury. Symptoms
for years worse over the past 6 months. Subsequent encounter.

EXAM:
MRI LUMBAR SPINE WITHOUT CONTRAST
TECHNIQUE: Multiplanar, multisequence MR imaging of the lumbar spine was
performed. No intravenous contrast was administered.

[Series 3: T2 · sagittal · 4.0mm · 0.44mm/px · 6 of 17 slices shown (1 of 2)]
[im 1/17]
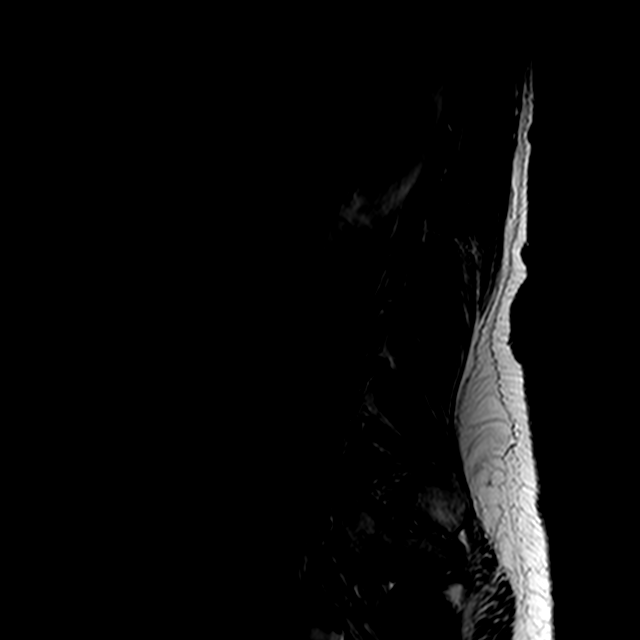
[im 3/17]
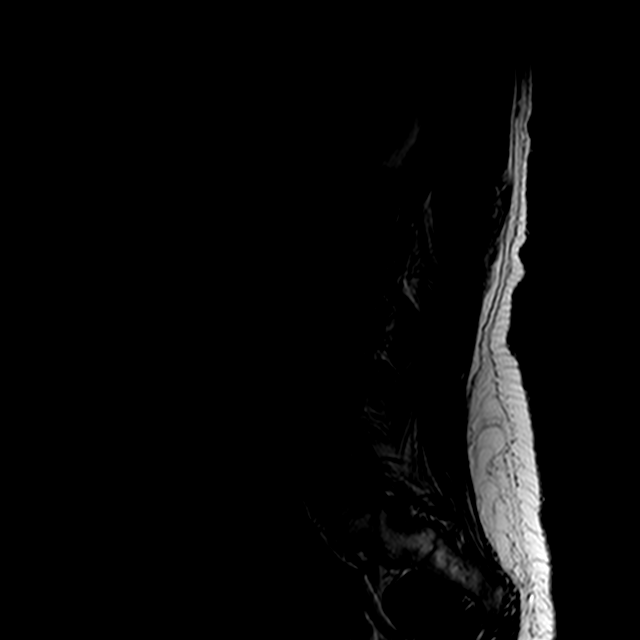
[im 6/17]
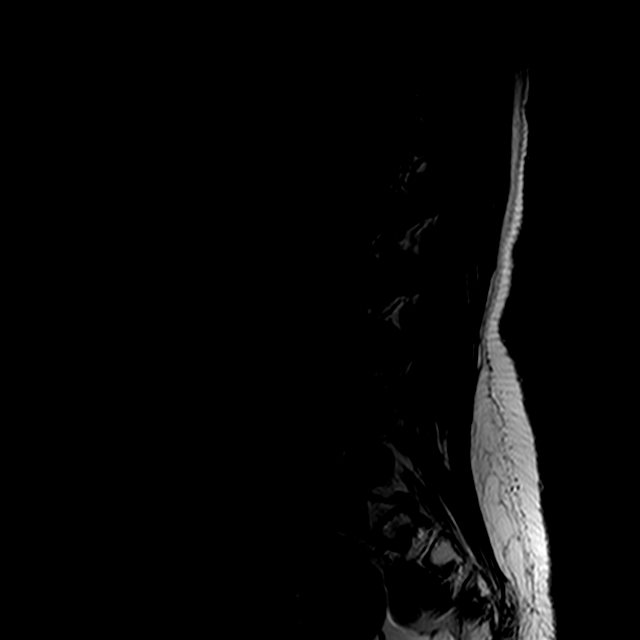
[im 9/17]
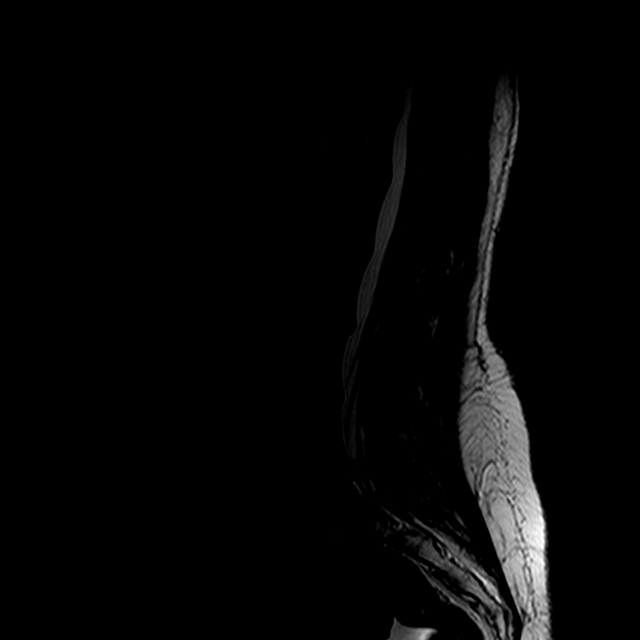
[im 11/17]
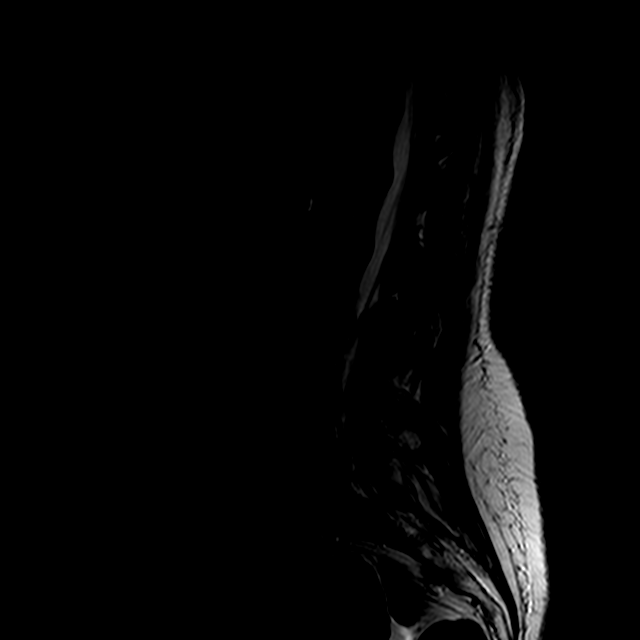
[im 14/17]
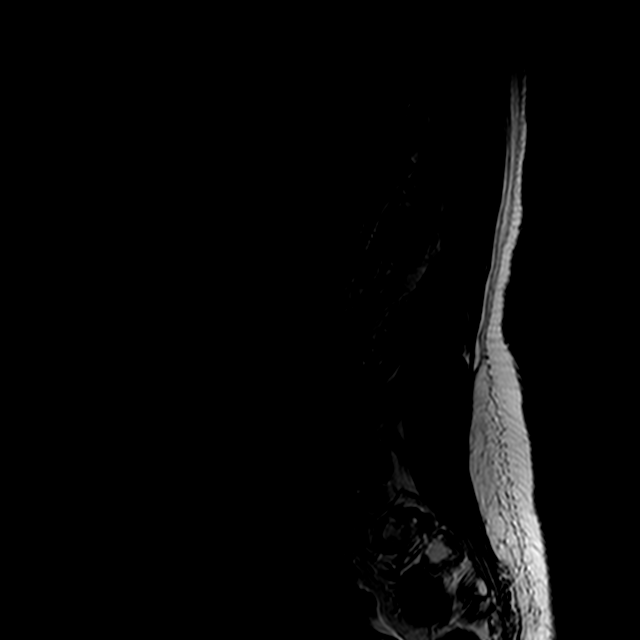

[Series 4: T1 · sagittal · 4.0mm · 0.44mm/px · 3 of 17 slices shown (1 of 2)]
[im 3/17]
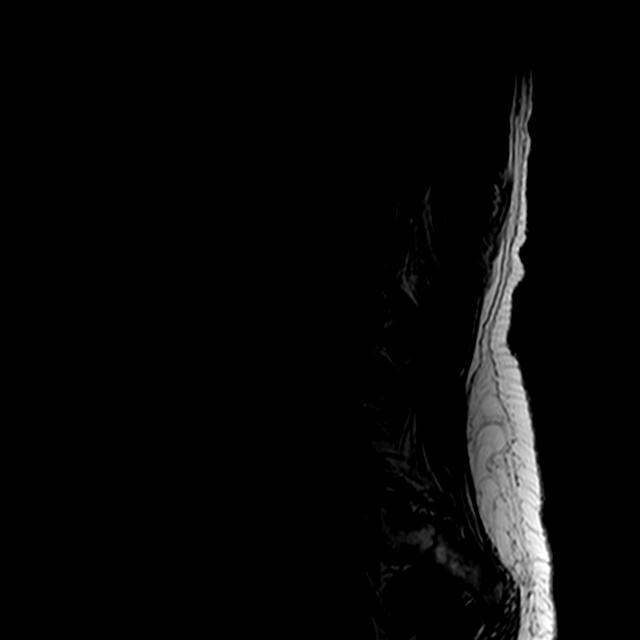
[im 9/17]
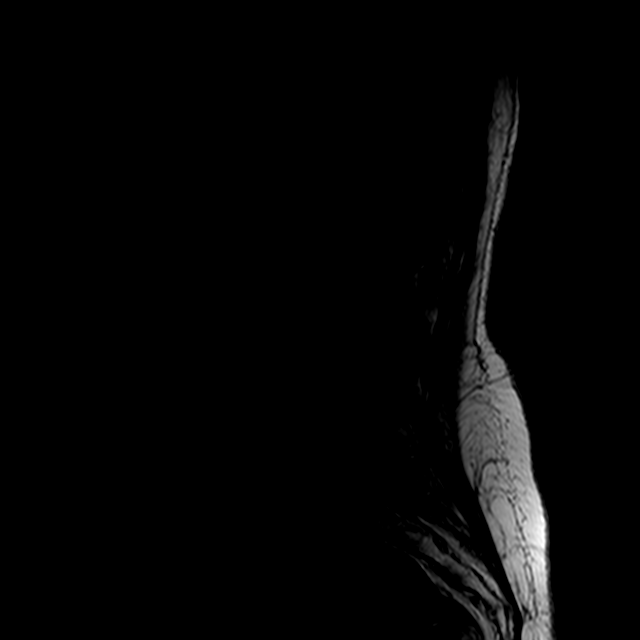
[im 14/17]
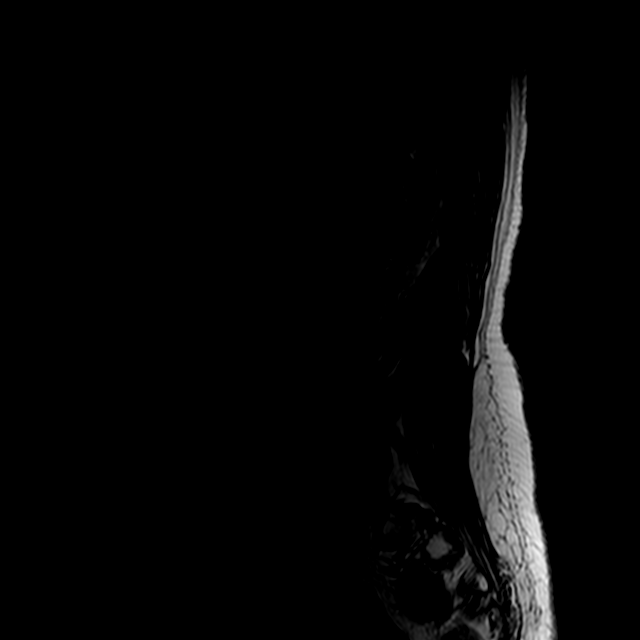

[Series 6: T2 · axial · 4.0mm · 0.39mm/px · z∈[-30,+119]mm · 3 of 38 slices shown (2 of 2)]
[im 6/38]
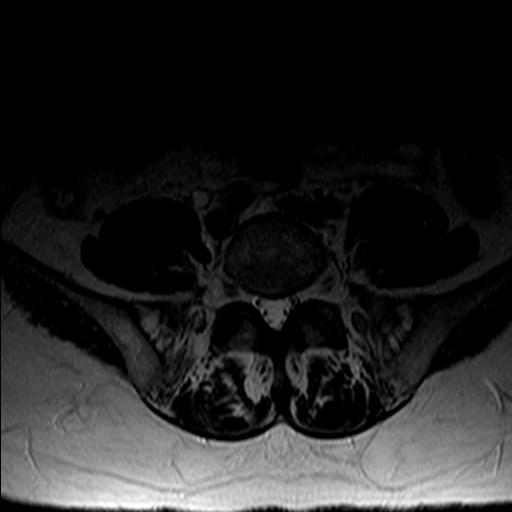
[im 20/38]
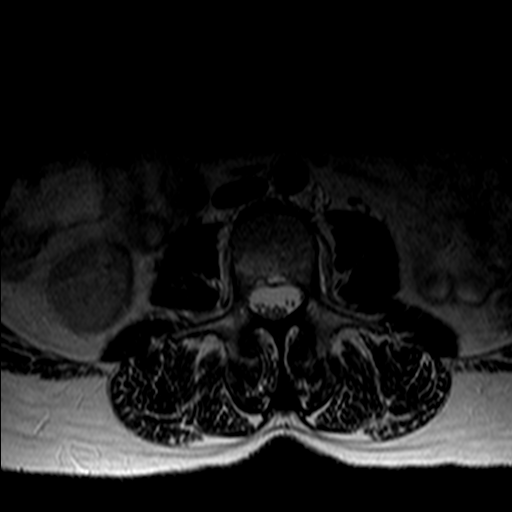
[im 32/38]
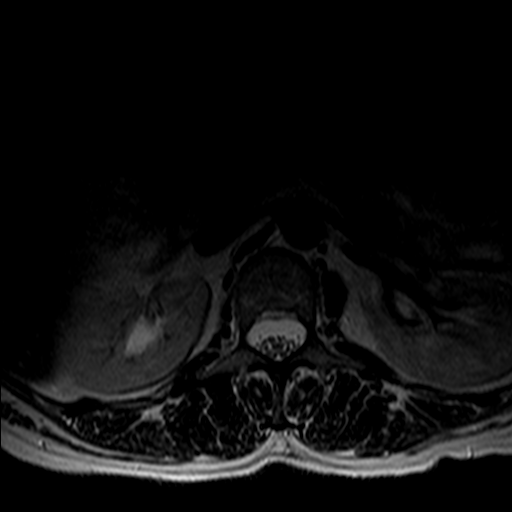

[Series 7: T1 · axial · 4.0mm · 0.39mm/px · z∈[-30,+119]mm · 3 of 38 slices shown (2 of 2)]
[im 6/38]
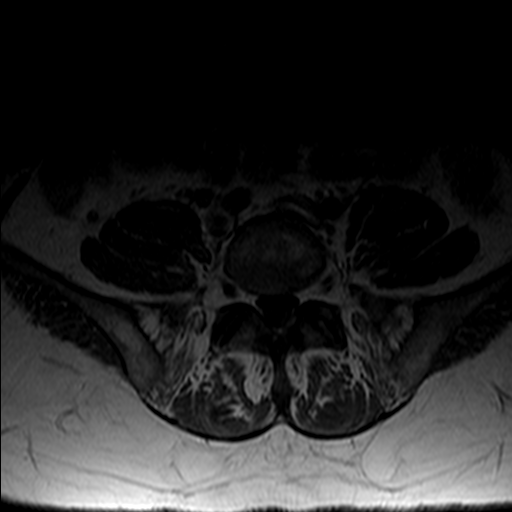
[im 20/38]
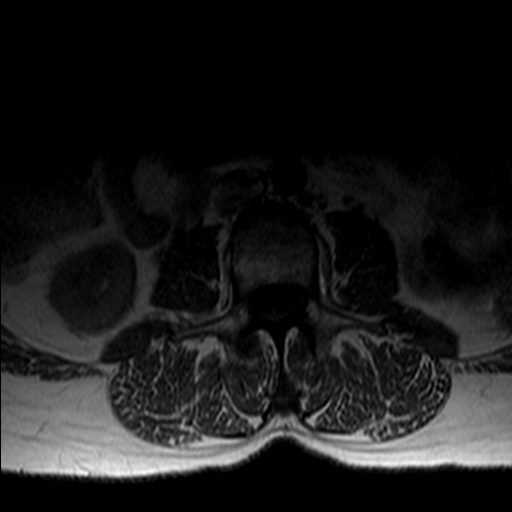
[im 32/38]
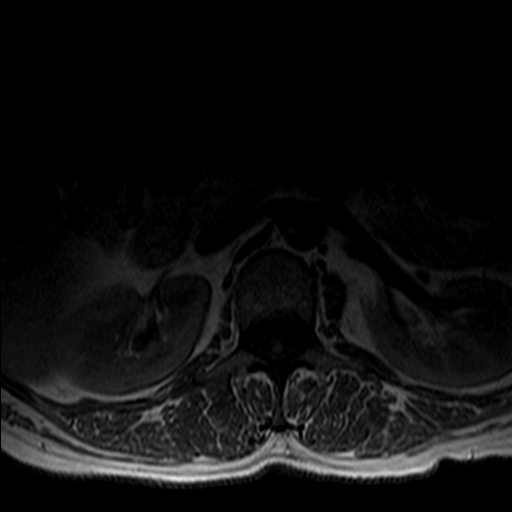

[15 of 48 positions shown; findings below may reference images not displayed]

FINDINGS: Last fully open disk space is labeled L5-S1. Present examination
incorporates from T11-12 disc space through lower sacrum.

Slightly heterogeneous bone marrow with scattered focal fatty
deposits without worrisome osseous lesion or significant change.

Retroflexed uterus incidentally noted. Fluid within the pelvis may
be related to recent ovulation.

T11-12 through L2-3 unremarkable.

L3-4: Minimal retrolisthesis. Bulge. Lateral extension slightly
greater on the left touching but not compressing the exiting left L3
nerve root. Very mild spinal stenosis.

L4-5: Facet degenerative changes. Mild ligamentum flavum hypertrophy
greater on the right. Mild bulge. Very mild spinal stenosis.

L5-S1: Minimal bulge. Very mild facet degenerative changes. No
significant spinal stenosis or nerve root compression.
IMPRESSION: Mild degenerative changes L3-4 through L5-S1 without significant
spinal stenosis or nerve root compression. Please see above for
further detail.

## 2018-01-25 ENCOUNTER — Emergency Department
Admission: EM | Admit: 2018-01-25 | Discharge: 2018-01-25 | Disposition: A | Discharge status: Home / Self Care | Payer: BLUE CROSS/BLUE SHIELD | Attending: Student in an Organized Health Care Education/Training Program | Admitting: Student in an Organized Health Care Education/Training Program

## 2018-01-25 ENCOUNTER — Emergency Department: Payer: BLUE CROSS/BLUE SHIELD

## 2018-01-25 ENCOUNTER — Other Ambulatory Visit: Payer: Self-pay

## 2018-01-25 ENCOUNTER — Encounter: Payer: Self-pay | Admitting: Emergency Medicine

## 2018-01-25 DIAGNOSIS — F419 Anxiety disorder, unspecified: Secondary | ICD-10-CM | POA: Diagnosis not present

## 2018-01-25 DIAGNOSIS — Z79899 Other long term (current) drug therapy: Secondary | ICD-10-CM | POA: Diagnosis not present

## 2018-01-25 DIAGNOSIS — S39012A Strain of muscle, fascia and tendon of lower back, initial encounter: Secondary | ICD-10-CM | POA: Diagnosis not present

## 2018-01-25 DIAGNOSIS — Y929 Unspecified place or not applicable: Secondary | ICD-10-CM | POA: Diagnosis not present

## 2018-01-25 DIAGNOSIS — Y9389 Activity, other specified: Secondary | ICD-10-CM | POA: Diagnosis not present

## 2018-01-25 DIAGNOSIS — X500XXA Overexertion from strenuous movement or load, initial encounter: Secondary | ICD-10-CM | POA: Diagnosis not present

## 2018-01-25 DIAGNOSIS — Y998 Other external cause status: Secondary | ICD-10-CM | POA: Diagnosis not present

## 2018-01-25 DIAGNOSIS — F329 Major depressive disorder, single episode, unspecified: Secondary | ICD-10-CM | POA: Diagnosis not present

## 2018-01-25 DIAGNOSIS — M545 Low back pain: Secondary | ICD-10-CM | POA: Diagnosis not present

## 2018-01-25 DIAGNOSIS — S3992XA Unspecified injury of lower back, initial encounter: Secondary | ICD-10-CM | POA: Diagnosis not present

## 2018-01-25 HISTORY — DX: Anxiety disorder, unspecified: F41.9

## 2018-01-25 HISTORY — DX: Depression, unspecified: F32.A

## 2018-01-25 HISTORY — DX: Major depressive disorder, single episode, unspecified: F32.9

## 2018-01-25 MED ORDER — ONDANSETRON HCL 4 MG/2ML IJ SOLN
4.0000 mg | Freq: Once | INTRAMUSCULAR | Status: AC
  Administered 2018-01-25: 4 mg via INTRAVENOUS
  Filled 2018-01-25: qty 2

## 2018-01-25 MED ORDER — HYDROMORPHONE HCL 1 MG/ML IJ SOLN
1.0000 mg | Freq: Once | INTRAMUSCULAR | Status: AC
  Administered 2018-01-25: 1 mg via INTRAVENOUS
  Filled 2018-01-25: qty 1

## 2018-01-25 MED ORDER — ORPHENADRINE CITRATE 30 MG/ML IJ SOLN
60.0000 mg | Freq: Two times a day (BID) | INTRAMUSCULAR | Status: DC
  Administered 2018-01-25: 60 mg via INTRAVENOUS
  Filled 2018-01-25: qty 2

## 2018-01-25 MED ORDER — METHYLPREDNISOLONE SODIUM SUCC 125 MG IJ SOLR
125.0000 mg | Freq: Once | INTRAMUSCULAR | Status: AC
  Administered 2018-01-25: 125 mg via INTRAVENOUS
  Filled 2018-01-25: qty 2

## 2018-01-25 MED ORDER — PREDNISONE 10 MG (48) PO TBPK: ORAL_TABLET | ORAL | 0 refills | Status: DC

## 2018-01-25 MED ORDER — HYDROCODONE-ACETAMINOPHEN 5-325 MG PO TABS: 1.0000 | ORAL_TABLET | Freq: Four times a day (QID) | ORAL | 0 refills | Status: DC | PRN

## 2018-01-25 MED ORDER — MORPHINE SULFATE (PF) 4 MG/ML IV SOLN
4.0000 mg | Freq: Once | INTRAVENOUS | Status: AC
  Administered 2018-01-25: 4 mg via INTRAVENOUS
  Filled 2018-01-25: qty 1

## 2018-01-25 MED ORDER — METAXALONE 800 MG PO TABS: 800.0000 mg | ORAL_TABLET | Freq: Three times a day (TID) | ORAL | 0 refills | Status: DC

## 2018-01-25 MED ORDER — ONDANSETRON 4 MG PO TBDP: 4.0000 mg | ORAL_TABLET | Freq: Three times a day (TID) | ORAL | 0 refills | Status: DC | PRN

## 2018-01-25 NOTE — Discharge Instructions (Addendum)
Apply ice to the lower back.  Keep a pillow underneath your knees if you are lying flat on your back.  In between your knees if you are lying on your side.  Once you are able to move around easier start with the lower back exercises.  Do these very gently as to not injure the muscles again.  Follow-up with Dr. Phyllis Ginger.  You will need to call him for an appointment.

## 2018-01-25 NOTE — ED Triage Notes (Signed)
Pt comes into the ED via POV c/o lower back pain after loading a pack of waters into her car yesterday.  Patient states that she didn't feel a pop but she felt it pull uncomfortably in the middle of her back.  She states that it is causing her to have nausea due to the pain.  Patient presents stiff in the wheel chair at this time but is in NAD with even and unlabored respirations.  Patient is tearful in triage at this time and states that it is increasing her panic attacks at home.

## 2018-01-25 NOTE — ED Notes (Signed)
Pt reports yesterday was putting a case of water in the car and strained her lower back. Pt reports pain was so severe she became anxious and almost passed out. Pt has also vomited from the pain. Pt reports pain is to the center of her lower back. Pt reports hx of bulging disc in L4 and L5 and bone spurs. Pt reports pain is usually on the left or right side this is the first time it has been in the middle. Denies loss of bowel or bladder.

## 2018-01-25 NOTE — ED Notes (Signed)
First Nurse Note: Pt c/o back pain. Pt is in NAD at this time.

## 2018-01-25 NOTE — ED Notes (Signed)
Pt given telephone to talk to MRI to answer screening questions.

## 2018-01-25 NOTE — ED Provider Notes (Signed)
[facility] Emergency Department Provider Note  ____________________________________________   First MD Initiated Contact with Patient 01/25/18 1216     (approximate)  I have reviewed the triage vital signs and the nursing notes.   HISTORY  Chief Complaint Back Pain    HPI Holly Le is a 52 y.o. female presents emergency department complaining of low back pain.  She states she was putting a case of water in the car and felt a searing pain up her lower back.  She states that she can hardly make it to the bed and almost passed out.  She has vomited from the pain.  Pain is located in the center of her lower spine.  She does have a history of bulging disc at L4 and L5.  Her normal pain is usually on the right side or left side but this is located in the center.  She denies any loss of bowel or bladder control.  She states as long as she lies still she is okay but any movement causes her to have pain.  She denies numbness or tingling to the lower extremities.    Past Medical History:  Diagnosis Date  . Anxiety   . Depression   . Grave's disease     Patient Active Problem List   Diagnosis Date Noted  . Candidosis 12/17/2017  . Primary insomnia 12/17/2017  . Dysuria 12/17/2017  . Lumbar disc disease with radiculopathy 06/26/2017  . Cervical disc disorder at C5-C6 level with radiculopathy 06/26/2017  . Screening for breast cancer 06/26/2017  . Psoriasis, unspecified 06/26/2017  . 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 CESAREAN SECTION     twins  . FOOT SURGERY    . LITHOTRIPSY    . SHOULDER SURGERY  01/2009   RIGHT    Prior to Admission medications   Medication Sig Start Date End Date Taking? Authorizing Provider  ALPRAZolam Duanne Moron) 0.5 MG tablet Take by mouth. 08/24/15   [provider]  Apremilast 30 MG TABS Take by mouth.    [provider]  citalopram (CELEXA) 10 MG tablet TAKE  THREE TABLETS EVERY DAY 12/17/17   Ronnell Freshwater, NP  clotrimazole-betamethasone (LOTRISONE) cream Apply 1 application topically 2 (two) times daily. 07/29/17   Ronnell Freshwater, NP  fluconazole (DIFLUCAN) 150 MG tablet Take 1 tablet po once. May repeat dose in 3 days as needed for persistent symptoms. 11/28/17   Ronnell Freshwater, NP  HYDROcodone-acetaminophen (NORCO/VICODIN) 5-325 MG tablet Take 1 tablet by mouth every 6 (six) hours as needed for moderate pain. 01/25/18   Donel Osowski, Linden Dolin, PA-C  metaxalone (SKELAXIN) 800 MG tablet Take 1 tablet (800 mg total) by mouth 3 (three) times daily. 01/25/18   Carlisle Torgeson, Linden Dolin, PA-C  ondansetron (ZOFRAN-ODT) 4 MG disintegrating tablet Take 1 tablet (4 mg total) by mouth every 8 (eight) hours as needed for nausea or vomiting. 01/25/18   Latisa Belay, Linden Dolin, PA-C  predniSONE (STERAPRED UNI-PAK 48 TAB) 10 MG (48) TBPK tablet Take 6 pills for 2 days , 5 pills for 2 days, 4 pills for 2 days, 3 pills for 2 days, 2 pills for 2 days, then 1 pill for 2 days 01/25/18   Versie Starks, PA-C  tiZANidine (ZANAFLEX) 4 MG tablet Take by mouth. 07/20/15   [provider]  zolpidem (AMBIEN) 10 MG tablet Take 1 tablet (10 mg total) by mouth at bedtime as needed for  sleep. 11/28/17   Ronnell Freshwater, NP    Allergies Codeine; Percocet [oxycodone-acetaminophen]; and Sulfa antibiotics  Family History  Problem Relation Age of Onset  . Lymphoma Father   . Lymphoma Paternal Aunt   . Cancer Maternal Grandfather        COLON    Social History Social History   Tobacco Use  . Smoking status: Never Smoker  . Smokeless tobacco: Never Used  Substance Use Topics  . Alcohol use: Yes  . Drug use: No    Review of Systems  Constitutional: No fever/chills Eyes: No visual changes. ENT: No sore throat. Respiratory: Denies cough Genitourinary: Negative for dysuria. Musculoskeletal: Positive for back pain. Skin: Negative for  rash.    ____________________________________________   PHYSICAL EXAM:  VITAL SIGNS: ED Triage Vitals  Enc Vitals Group     BP 01/25/18 1210 98/64     Pulse Rate 01/25/18 1208 73     Resp 01/25/18 1208 20     Temp 01/25/18 1208 97.9 F (36.6 C)     Temp Source 01/25/18 1208 Oral     SpO2 01/25/18 1208 100 %     Weight 01/25/18 1209 139 lb (63 kg)     Height 01/25/18 1209 5\' 4"  (1.626 m)     Head Circumference --      Peak Flow --      Pain Score 01/25/18 1209 10     Pain Loc --      Pain Edu? --      Excl. in [place]? --     Constitutional: Alert and oriented. Well appearing and in no acute distress. Eyes: Conjunctivae are normal.  Head: Atraumatic. Nose: No congestion/rhinnorhea. Mouth/Throat: Mucous membranes are moist.   Neck:  supple no lymphadenopathy noted Cardiovascular: Normal rate, regular rhythm. Heart sounds are normal Respiratory: Normal respiratory effort.  No retractions, lungs c t a  Abd: soft nontender bs normal all 4 quad GU: deferred Musculoskeletal: Lumbar spine is tender.  Patient has decreased range of motion due to the level of discomfort.  She is laying flat on the stretcher.  She has 10 out of 10 strength in the great toes, good strength in the lower extremities, straight leg raise reproduces pain in the lower back.  She is neurovascularly intact. Neurologic:  Normal speech and language.  Skin:  Skin is warm, dry and intact. No rash noted. Psychiatric: Mood and affect are normal. Speech and behavior are normal.  ____________________________________________   LABS (all labs ordered are listed, but only abnormal results are displayed)  Labs Reviewed - No data to display ____________________________________________   ____________________________________________  RADIOLOGY  X-ray of the lumbar spine is negative for any acute abnormality MRI of lumbar spine shows no change from  2017  ____________________________________________   PROCEDURES  Procedure(s) performed: Saline lock, morphine 4 mg IV, Zofran 4 mg IV, Medrol 25 mg IV, Dilaudid 1 mg IV  Procedures    ____________________________________________   INITIAL IMPRESSION / ASSESSMENT AND PLAN / ED COURSE  Pertinent labs & imaging results that were available during my care of the patient were reviewed by me and considered in my medical decision making (see chart for details).   Patient is a 52 year old female presents emergency department complaining of severe low back pain.  She picked up a case of water and felt a searing pain of the middle of her spine.  She states she was unable to hardly walk into the house.  She started vomiting due to  the pain.  She did take some medicine that she had at home.  This is not been helping.  She denies any abdominal pain, loss of bowel or bladder, or numbness tingling in the extremities.  On physical exam the patient appears to be in mild distress due to discomfort.  She is lying flat on the stretcher.  She is resistant to movement due to the discomfort it causes.  The lumbar spine is tender to palpation.  She is neurovascularly intact.  Both great toes have 10 out of 10 strength.  Pain is reproduced with any motion of the lower back.  X-ray of the lumbar spine is negative for any acute abnormality. MRI of the lumbar spine shows no changes from 2017.  Discussed the results with the patient.  She states that after the Dilaudid she did finally have some relief.  However she is still being very still and stoic as she does not want to move to create any pain.  She is to follow-up with Dr.Chasniss as she saw him in 2017.  He gave her pain injections into her spine.  She was given a prescription for hydrocodone, baclofen, Sterapred, and Zofran for any nausea associated with the medication.  She states she understands all the instructions and was discharged in stable condition      As part of my medical decision making, I reviewed the following data within the [facility] notes reviewed and incorporated, Old chart reviewed, Radiograph reviewed x-ray lumbar spine is negative, MRI of lumbar spine shows no change since 2017, Notes from prior ED visits and [place] Controlled Substance Database  ____________________________________________   FINAL CLINICAL IMPRESSION(S) / ED DIAGNOSES  Final diagnoses:  Strain of lumbar region, initial encounter      NEW MEDICATIONS STARTED DURING THIS VISIT:  New Prescriptions   HYDROCODONE-ACETAMINOPHEN (NORCO/VICODIN) 5-325 MG TABLET    Take 1 tablet by mouth every 6 (six) hours as needed for moderate pain.   METAXALONE (SKELAXIN) 800 MG TABLET    Take 1 tablet (800 mg total) by mouth 3 (three) times daily.   ONDANSETRON (ZOFRAN-ODT) 4 MG DISINTEGRATING TABLET    Take 1 tablet (4 mg total) by mouth every 8 (eight) hours as needed for nausea or vomiting.   PREDNISONE (STERAPRED UNI-PAK 48 TAB) 10 MG (48) TBPK TABLET    Take 6 pills for 2 days , 5 pills for 2 days, 4 pills for 2 days, 3 pills for 2 days, 2 pills for 2 days, then 1 pill for 2 days     Note:  This document was prepared using Dragon voice recognition software and may include unintentional dictation errors.    Versie Starks, PA-C 01/25/18 Donato Schultz, MD 01/27/18 2250

## 2018-03-23 DIAGNOSIS — L400 Psoriasis vulgaris: Secondary | ICD-10-CM | POA: Diagnosis not present

## 2018-03-23 DIAGNOSIS — Z1283 Encounter for screening for malignant neoplasm of skin: Secondary | ICD-10-CM | POA: Diagnosis not present

## 2018-03-23 DIAGNOSIS — D485 Neoplasm of uncertain behavior of skin: Secondary | ICD-10-CM | POA: Diagnosis not present

## 2018-03-23 DIAGNOSIS — L812 Freckles: Secondary | ICD-10-CM | POA: Diagnosis not present

## 2018-04-02 ENCOUNTER — Ambulatory Visit (INDEPENDENT_AMBULATORY_CARE_PROVIDER_SITE_OTHER): Payer: Self-pay | Admitting: Nurse Practitioner

## 2018-04-02 ENCOUNTER — Encounter: Payer: Self-pay | Admitting: Nurse Practitioner

## 2018-04-02 VITALS — BP 100/65 | HR 75 | Resp 16 | Ht 64.0 in | Wt 142.0 lb

## 2018-04-02 DIAGNOSIS — M5116 Intervertebral disc disorders with radiculopathy, lumbar region: Secondary | ICD-10-CM

## 2018-04-02 DIAGNOSIS — F5101 Primary insomnia: Secondary | ICD-10-CM

## 2018-04-02 DIAGNOSIS — Z23 Encounter for immunization: Secondary | ICD-10-CM | POA: Diagnosis not present

## 2018-04-02 DIAGNOSIS — F411 Generalized anxiety disorder: Secondary | ICD-10-CM | POA: Diagnosis not present

## 2018-04-02 DIAGNOSIS — Z1211 Encounter for screening for malignant neoplasm of colon: Secondary | ICD-10-CM

## 2018-04-02 MED ORDER — ZOLPIDEM TARTRATE 10 MG PO TABS: 10.0000 mg | ORAL_TABLET | Freq: Every evening | ORAL | 3 refills | Status: DC | PRN

## 2018-04-02 MED ORDER — CITALOPRAM HYDROBROMIDE 10 MG PO TABS: 10.0000 mg | ORAL_TABLET | Freq: Every day | ORAL | 3 refills | Status: DC

## 2018-04-02 NOTE — Progress Notes (Signed)
[facility] [place], [place] [address]  Internal MEDICINE  Office Visit Note  Patient Name: Holly Le  [id number]  [id number]  Date of Service: 04/02/2018  Chief Complaint  Patient presents with  . Depression  . Anxiety  . Quality Metric Gaps    colonscopy    The patient is c/o intermittent and severe lower back pain. Had to be seen in ER in early august because pain was so severe. CT scan and MRI were essentially unchanged from prior exams. She was given a prednisone taper which did help, however, made her gain weight. She is avoiding the pain medication and muscle relaxer as they cause her to be very tired. She has had this in the past. Did see Dr. Sharlet Salina and got injections into her spine which gave her several months. She also saw a Restaurant manager, fast food. She is planning to make appointments for both of these to help her pain.       Current Medication: Outpatient Encounter Medications as of 04/02/2018  Medication Sig  . ALPRAZolam (XANAX) 0.5 MG tablet Take by mouth at bedtime as needed.   . citalopram (CELEXA) 10 MG tablet Take 1 tablet (10 mg total) by mouth daily.  . metaxalone (SKELAXIN) 800 MG tablet Take 1 tablet (800 mg total) by mouth 3 (three) times daily.  . ondansetron (ZOFRAN-ODT) 4 MG disintegrating tablet Take 1 tablet (4 mg total) by mouth every 8 (eight) hours as needed for nausea or vomiting.  Marland Kitchen zolpidem (AMBIEN) 10 MG tablet Take 1 tablet (10 mg total) by mouth at bedtime as needed for sleep.  . [DISCONTINUED] citalopram (CELEXA) 10 MG tablet TAKE THREE TABLETS EVERY DAY  . [DISCONTINUED] zolpidem (AMBIEN) 10 MG tablet Take 1 tablet (10 mg total) by mouth at bedtime as needed for sleep.  . [DISCONTINUED] Apremilast 30 MG TABS Take by mouth.  . [DISCONTINUED] clotrimazole-betamethasone (LOTRISONE) cream Apply 1 application topically 2 (two) times daily. (Patient not taking: Reported on 04/02/2018)  . [DISCONTINUED] fluconazole (DIFLUCAN)  150 MG tablet Take 1 tablet po once. May repeat dose in 3 days as needed for persistent symptoms. (Patient not taking: Reported on 04/02/2018)  . [DISCONTINUED] HYDROcodone-acetaminophen (NORCO/VICODIN) 5-325 MG tablet Take 1 tablet by mouth every 6 (six) hours as needed for moderate pain. (Patient not taking: Reported on 04/02/2018)  . [DISCONTINUED] predniSONE (STERAPRED UNI-PAK 48 TAB) 10 MG (48) TBPK tablet Take 6 pills for 2 days , 5 pills for 2 days, 4 pills for 2 days, 3 pills for 2 days, 2 pills for 2 days, then 1 pill for 2 days (Patient not taking: Reported on 04/02/2018)  . [DISCONTINUED] tiZANidine (ZANAFLEX) 4 MG tablet Take by mouth.   No facility-administered encounter medications on file as of 04/02/2018.     Surgical History: Past Surgical History:  Procedure Laterality Date  . CESAREAN SECTION     twins  . FOOT SURGERY    . LITHOTRIPSY    . SHOULDER SURGERY  01/2009   RIGHT    Medical History: Past Medical History:  Diagnosis Date  . Anxiety   . Depression   . Grave's disease     Family History: Family History  Problem Relation Age of Onset  . Lymphoma Father   . Lymphoma Paternal Aunt   . Cancer Maternal Grandfather        COLON    Social History   Socioeconomic History  . Marital status: Married    Spouse name: Not on file  .  Number of children: Not on file  . Years of education: Not on file  . Highest education level: Not on file  Occupational History  . Not on file  Social Needs  . Financial resource strain: Not on file  . Food insecurity:    Worry: Not on file    Inability: Not on file  . Transportation needs:    Medical: Not on file    Non-medical: Not on file  Tobacco Use  . Smoking status: Never Smoker  . Smokeless tobacco: Never Used  Substance and Sexual Activity  . Alcohol use: Yes  . Drug use: No  . Sexual activity: Not on file  Lifestyle  . Physical activity:    Days per week: Not on file    Minutes per session: Not on file   . Stress: Not on file  Relationships  . Social connections:    Talks on phone: Not on file    Gets together: Not on file    Attends religious service: Not on file    Active member of club or organization: Not on file    Attends meetings of clubs or organizations: Not on file    Relationship status: Not on file  . Intimate partner violence:    Fear of current or ex partner: Not on file    Emotionally abused: Not on file    Physically abused: Not on file    Forced sexual activity: Not on file  Other Topics Concern  . Not on file  Social History Narrative  . Not on file      Review of Systems  Constitutional: Negative for chills, fatigue and unexpected weight change.  HENT: Negative for congestion, postnasal drip, rhinorrhea, sneezing and sore throat.   Eyes: Negative.  Negative for redness.  Respiratory: Negative for cough, chest tightness, shortness of breath and wheezing.   Cardiovascular: Negative for chest pain and palpitations.  Gastrointestinal: Positive for constipation. Negative for abdominal pain, diarrhea, nausea and vomiting.  Endocrine: Negative for cold intolerance, heat intolerance, polydipsia, polyphagia and polyuria.  Genitourinary: Negative for dysuria and frequency.  Musculoskeletal: Positive for arthralgias. Negative for back pain, joint swelling and neck pain.  Skin: Negative for rash.  Allergic/Immunologic: Positive for environmental allergies.  Neurological: Negative for dizziness, tremors, numbness and headaches.  Hematological: Negative for adenopathy. Does not bruise/bleed easily.  Psychiatric/Behavioral: Positive for sleep disturbance. Negative for behavioral problems (Depression) and suicidal ideas. The patient is not nervous/anxious.        Takes zolpidem 10mg  at night when needed for insomnia. She does need to have refill today    Today's Vitals   04/02/18 1003  BP: 100/65  Pulse: 75  Resp: 16  SpO2: 98%  Weight: 142 lb (64.4 kg)  Height: 5'  4" (1.626 m)    Physical Exam  Constitutional: She is oriented to person, place, and time. She appears well-developed and well-nourished. No distress.  HENT:  Head: Normocephalic and atraumatic.  Nose: Nose normal.  Mouth/Throat: Oropharynx is clear and moist. No oropharyngeal exudate.  Eyes: Pupils are equal, round, and reactive to light. Conjunctivae and EOM are normal.  Neck: Normal range of motion. Neck supple. No JVD present. Carotid bruit is not present. No tracheal deviation present. No thyromegaly present.  Cardiovascular: Normal rate, regular rhythm and normal heart sounds. Exam reveals no gallop and no friction rub.  No murmur heard. Pulmonary/Chest: Effort normal and breath sounds normal. No respiratory distress. She has no wheezes. She has no rales.  She exhibits no tenderness. Right breast exhibits no inverted nipple, no mass, no nipple discharge, no skin change and no tenderness. Left breast exhibits no inverted nipple, no mass, no nipple discharge, no skin change and no tenderness.  Abdominal: Soft. Bowel sounds are normal. There is no tenderness.  Musculoskeletal: Normal range of motion.  There is moderate lower back pain. Worse with bending and twisting at the waist. Changing seated positions causes grimacing. No visible or palpable abnormalities are noted at this time.   Lymphadenopathy:    She has no cervical adenopathy.  Neurological: She is alert and oriented to person, place, and time. No cranial nerve deficit.  Skin: Skin is warm and dry. Capillary refill takes less than 2 seconds. She is not diaphoretic.  Psychiatric: Her speech is normal and behavior is normal. Judgment and thought content normal. Her mood appears anxious. Cognition and memory are normal.  Nursing note and vitals reviewed.  Assessment/Plan: 1. Lumbar disc disease with radiculopathy Patient in moderate pain today. Encouraged her to take pain medication/muscle relaxer as needed and as prescribed. Also  advised she make appointment with Dr. Sharlet Salina and with chiropractor for further evaluation and treatment.   2. Generalized anxiety disorder Well managed. Rarely using xanax. Continue citalopram 10mg  daily.  - citalopram (CELEXA) 10 MG tablet; Take 1 tablet (10 mg total) by mouth daily.  Dispense: 90 tablet; Refill: 3  3. Primary insomnia May continue ambien 10mg  at bedtime as needed. Refill provided today.  - zolpidem (AMBIEN) 10 MG tablet; Take 1 tablet (10 mg total) by mouth at bedtime as needed for sleep.  Dispense: 30 tablet; Refill: 3  4. Flu vaccine need - Flu Vaccine MDCK QUAD PF  5. Screening for colon cancer - Ambulatory referral to Gastroenterology  General Counseling: Holly Le understanding of the findings of todays visit and agrees with plan of treatment. I have discussed any further diagnostic evaluation that may be needed or ordered today. We also reviewed her medications today. she has been encouraged to call the office with any questions or concerns that should arise related to todays visit.  This patient was seen by Leretha Pol FNP Collaboration with Dr Lavera Guise as a part of collaborative care agreement  Orders Placed This Encounter  Procedures  . Flu Vaccine MDCK QUAD PF  . Ambulatory referral to Gastroenterology    Meds ordered this encounter  Medications  . citalopram (CELEXA) 10 MG tablet    Sig: Take 1 tablet (10 mg total) by mouth daily.    Dispense:  90 tablet    Refill:  3    FOR NEXT FILL THANKS    Order Specific Question:   Supervising Provider    Answer:   Lavera Guise [[id number]]  . zolpidem (AMBIEN) 10 MG tablet    Sig: Take 1 tablet (10 mg total) by mouth at bedtime as needed for sleep.    Dispense:  30 tablet    Refill:  3    Order Specific Question:   Supervising Provider    Answer:   Lavera Guise [[id number]]    Time spent: 24 Minutes      Dr Lavera Guise Internal medicine

## 2018-04-03 DIAGNOSIS — M5136 Other intervertebral disc degeneration, lumbar region: Secondary | ICD-10-CM | POA: Diagnosis not present

## 2018-04-03 DIAGNOSIS — M5416 Radiculopathy, lumbar region: Secondary | ICD-10-CM | POA: Diagnosis not present

## 2018-05-01 DIAGNOSIS — M5416 Radiculopathy, lumbar region: Secondary | ICD-10-CM | POA: Diagnosis not present

## 2018-05-01 DIAGNOSIS — M5136 Other intervertebral disc degeneration, lumbar region: Secondary | ICD-10-CM | POA: Diagnosis not present

## 2018-05-04 ENCOUNTER — Ambulatory Visit
Admission: RE | Admit: 2018-05-04 | Discharge: 2018-05-04 | Disposition: A | Discharge status: Home / Self Care | Payer: BLUE CROSS/BLUE SHIELD | Source: Ambulatory Visit | Attending: Nurse Practitioner | Admitting: Nurse Practitioner

## 2018-05-04 DIAGNOSIS — Z1239 Encounter for other screening for malignant neoplasm of breast: Secondary | ICD-10-CM | POA: Diagnosis not present

## 2018-05-04 DIAGNOSIS — Z1231 Encounter for screening mammogram for malignant neoplasm of breast: Secondary | ICD-10-CM | POA: Diagnosis not present

## 2018-05-15 DIAGNOSIS — Z1211 Encounter for screening for malignant neoplasm of colon: Secondary | ICD-10-CM | POA: Diagnosis not present

## 2018-06-09 DIAGNOSIS — Z8371 Family history of colonic polyps: Secondary | ICD-10-CM | POA: Diagnosis not present

## 2018-06-09 DIAGNOSIS — Z1211 Encounter for screening for malignant neoplasm of colon: Secondary | ICD-10-CM | POA: Diagnosis not present

## 2018-06-09 DIAGNOSIS — K640 First degree hemorrhoids: Secondary | ICD-10-CM | POA: Diagnosis not present

## 2018-06-09 DIAGNOSIS — Z86010 Personal history of colonic polyps: Secondary | ICD-10-CM | POA: Diagnosis not present

## 2018-06-09 DIAGNOSIS — D120 Benign neoplasm of cecum: Secondary | ICD-10-CM | POA: Diagnosis not present

## 2018-07-06 DIAGNOSIS — M5136 Other intervertebral disc degeneration, lumbar region: Secondary | ICD-10-CM | POA: Diagnosis not present

## 2018-07-06 DIAGNOSIS — M5416 Radiculopathy, lumbar region: Secondary | ICD-10-CM | POA: Diagnosis not present

## 2018-07-08 ENCOUNTER — Other Ambulatory Visit: Payer: Self-pay | Admitting: Nurse Practitioner

## 2018-07-08 DIAGNOSIS — F5101 Primary insomnia: Secondary | ICD-10-CM

## 2018-07-08 MED ORDER — ZOLPIDEM TARTRATE 10 MG PO TABS: 10.0000 mg | ORAL_TABLET | Freq: Every evening | ORAL | 0 refills | Status: DC | PRN

## 2018-07-08 NOTE — Progress Notes (Signed)
Renewed prescription for zolpidem for 30 day prescription per pharmacy request.

## 2018-07-15 DIAGNOSIS — M5441 Lumbago with sciatica, right side: Secondary | ICD-10-CM | POA: Diagnosis not present

## 2018-07-27 DIAGNOSIS — M5416 Radiculopathy, lumbar region: Secondary | ICD-10-CM | POA: Diagnosis not present

## 2018-07-27 DIAGNOSIS — M5136 Other intervertebral disc degeneration, lumbar region: Secondary | ICD-10-CM | POA: Diagnosis not present

## 2018-08-04 ENCOUNTER — Ambulatory Visit: Payer: Self-pay | Admitting: Nurse Practitioner

## 2018-08-06 ENCOUNTER — Encounter: Payer: Self-pay | Admitting: Nurse Practitioner

## 2018-08-06 ENCOUNTER — Ambulatory Visit: Payer: BC Managed Care – PPO | Admitting: Nurse Practitioner

## 2018-08-06 VITALS — BP 100/66 | HR 65 | Resp 16 | Ht 64.0 in | Wt 141.4 lb

## 2018-08-06 DIAGNOSIS — Z79899 Other long term (current) drug therapy: Secondary | ICD-10-CM

## 2018-08-06 DIAGNOSIS — F411 Generalized anxiety disorder: Secondary | ICD-10-CM

## 2018-08-06 DIAGNOSIS — F5101 Primary insomnia: Secondary | ICD-10-CM | POA: Diagnosis not present

## 2018-08-06 LAB — POCT URINE DRUG SCREEN
POC Amphetamine UR: NOT DETECTED
POC BENZODIAZEPINES UR: NOT DETECTED
POC Barbiturate UR: NOT DETECTED
POC Cocaine UR: NOT DETECTED
POC ECSTASY UR: NOT DETECTED
POC MARIJUANA UR: NOT DETECTED
POC Methadone UR: NOT DETECTED
POC Methamphetamine UR: NOT DETECTED
POC OPIATE UR: NOT DETECTED
POC OXYCODONE UR: NOT DETECTED
POC PHENCYCLIDINE UR: NOT DETECTED
POC TRICYCLICS UR: NOT DETECTED

## 2018-08-06 MED ORDER — ALPRAZOLAM 0.5 MG PO TABS: 0.5000 mg | ORAL_TABLET | Freq: Every day | ORAL | 3 refills | Status: DC | PRN

## 2018-08-06 MED ORDER — CITALOPRAM HYDROBROMIDE 10 MG PO TABS: 10.0000 mg | ORAL_TABLET | Freq: Every day | ORAL | 3 refills | Status: DC

## 2018-08-06 MED ORDER — ZOLPIDEM TARTRATE 10 MG PO TABS: 10.0000 mg | ORAL_TABLET | Freq: Every evening | ORAL | 0 refills | Status: DC | PRN

## 2018-08-06 NOTE — Progress Notes (Signed)
[facility] [place], [place] [address]  Internal MEDICINE  Office Visit Note  Patient Name: Holly Le  [id number]  [id number]  Date of Service: 08/06/2018  Chief Complaint  Patient presents with  . Medical Management of Chronic Issues    4 month follow up, medication refills    The patient is here for routine follow up. She suffers from generalized anxiety disorder and depression. Takes citalopram every day which manages symptoms very well. Her aunt passed away 3 weeks ago. States that she did need to take a few of her alprazolam at that time. Otherwise, she barely takes this. Last prescription given in 07/2017. She does need new prescription for this.       Current Medication: Outpatient Encounter Medications as of 08/06/2018  Medication Sig  . ALPRAZolam (XANAX) 0.5 MG tablet Take 1 tablet (0.5 mg total) by mouth daily as needed.  . citalopram (CELEXA) 10 MG tablet Take 1 tablet (10 mg total) by mouth daily.  . metaxalone (SKELAXIN) 800 MG tablet Take 1 tablet (800 mg total) by mouth 3 (three) times daily.  . ondansetron (ZOFRAN-ODT) 4 MG disintegrating tablet Take 1 tablet (4 mg total) by mouth every 8 (eight) hours as needed for nausea or vomiting.  Marland Kitchen zolpidem (AMBIEN) 10 MG tablet Take 1 tablet (10 mg total) by mouth at bedtime as needed for sleep.  . [DISCONTINUED] ALPRAZolam (XANAX) 0.5 MG tablet Take by mouth at bedtime as needed.   . [DISCONTINUED] citalopram (CELEXA) 10 MG tablet Take 1 tablet (10 mg total) by mouth daily.  . [DISCONTINUED] zolpidem (AMBIEN) 10 MG tablet Take 1 tablet (10 mg total) by mouth at bedtime as needed for sleep.   No facility-administered encounter medications on file as of 08/06/2018.     Surgical History: Past Surgical History:  Procedure Laterality Date  . back injection    . CESAREAN SECTION     twins  . FOOT SURGERY    . LITHOTRIPSY    . SHOULDER SURGERY  01/2009   RIGHT    Medical History: Past  Medical History:  Diagnosis Date  . Anxiety   . Depression   . Grave's disease     Family History: Family History  Problem Relation Age of Onset  . Lymphoma Father   . Lymphoma Paternal Aunt   . Cancer Maternal Grandfather        COLON  . Breast cancer Paternal Aunt     Social History   Socioeconomic History  . Marital status: Married    Spouse name: Not on file  . Number of children: Not on file  . Years of education: Not on file  . Highest education level: Not on file  Occupational History  . Not on file  Social Needs  . Financial resource strain: Not on file  . Food insecurity:    Worry: Not on file    Inability: Not on file  . Transportation needs:    Medical: Not on file    Non-medical: Not on file  Tobacco Use  . Smoking status: Never Smoker  . Smokeless tobacco: Never Used  Substance and Sexual Activity  . Alcohol use: Yes    Comment: occasionally  . Drug use: No  . Sexual activity: Not on file  Lifestyle  . Physical activity:    Days per week: Not on file    Minutes per session: Not on file  . Stress: Not on file  Relationships  . Social  connections:    Talks on phone: Not on file    Gets together: Not on file    Attends religious service: Not on file    Active member of club or organization: Not on file    Attends meetings of clubs or organizations: Not on file    Relationship status: Not on file  . Intimate partner violence:    Fear of current or ex partner: Not on file    Emotionally abused: Not on file    Physically abused: Not on file    Forced sexual activity: Not on file  Other Topics Concern  . Not on file  Social History Narrative  . Not on file      Review of Systems  Constitutional: Negative for activity change, chills, fatigue and unexpected weight change.  HENT: Negative for congestion, postnasal drip, rhinorrhea, sneezing and sore throat.   Respiratory: Negative for cough, chest tightness, shortness of breath and wheezing.    Cardiovascular: Negative for chest pain and palpitations.  Gastrointestinal: Positive for constipation. Negative for abdominal pain, diarrhea, nausea and vomiting.  Endocrine: Negative for cold intolerance, heat intolerance, polydipsia and polyuria.  Musculoskeletal: Positive for arthralgias and back pain. Negative for joint swelling and neck pain.       Recently had injection into her spine which is helping her pain.  Skin: Negative for rash.  Allergic/Immunologic: Positive for environmental allergies.  Neurological: Negative for dizziness, tremors, numbness and headaches.  Hematological: Negative for adenopathy. Does not bruise/bleed easily.  Psychiatric/Behavioral: Positive for dysphoric mood and sleep disturbance. Negative for behavioral problems (Depression) and suicidal ideas. The patient is nervous/anxious.        Takes zolpidem 10mg  at night when needed for insomnia. She does need to have refill today   Today's Vitals   08/06/18 0957  BP: 100/66  Pulse: 65  Resp: 16  SpO2: 100%  Weight: 141 lb 6.4 oz (64.1 kg)  Height: 5\' 4"  (1.626 m)   Body mass index is 24.27 kg/m.  Physical Exam Vitals signs and nursing note reviewed.  Constitutional:      General: She is not in acute distress.    Appearance: Normal appearance. She is well-developed. She is not diaphoretic.  HENT:     Head: Normocephalic and atraumatic.     Nose: Nose normal.     Mouth/Throat:     Pharynx: No oropharyngeal exudate.  Eyes:     Conjunctiva/sclera: Conjunctivae normal.     Pupils: Pupils are equal, round, and reactive to light.  Neck:     Musculoskeletal: Normal range of motion and neck supple.     Thyroid: No thyromegaly.     Vascular: No carotid bruit or JVD.     Trachea: No tracheal deviation.  Cardiovascular:     Rate and Rhythm: Normal rate and regular rhythm.     Heart sounds: Normal heart sounds. No murmur. No friction rub. No gallop.   Pulmonary:     Effort: Pulmonary effort is normal.  No respiratory distress.     Breath sounds: Normal breath sounds. No wheezing or rales.  Chest:     Chest wall: No tenderness.     Breasts:        Right: No inverted nipple, mass, nipple discharge, skin change or tenderness.        Left: No inverted nipple, mass, nipple discharge, skin change or tenderness.  Abdominal:     General: Bowel sounds are normal.     Palpations: Abdomen is  soft.     Tenderness: There is no abdominal tenderness.  Musculoskeletal: Normal range of motion.  Lymphadenopathy:     Cervical: No cervical adenopathy.  Skin:    General: Skin is warm and dry.     Capillary Refill: Capillary refill takes less than 2 seconds.  Neurological:     Mental Status: She is alert and oriented to person, place, and time.     Cranial Nerves: No cranial nerve deficit.  Psychiatric:        Mood and Affect: Mood is anxious.        Speech: Speech normal.        Behavior: Behavior normal.        Thought Content: Thought content normal.        Judgment: Judgment normal.   Assessment/Plan:  1. Generalized anxiety disorder Generally stable, though grieving the loss of her aunt. Continue citalopram 10mg  daily. May take alprazolam 0.5mg  daily if needed for acute anxiety  - ALPRAZolam (XANAX) 0.5 MG tablet; Take 1 tablet (0.5 mg total) by mouth daily as needed.  Dispense: 30 tablet; Refill: 3 - citalopram (CELEXA) 10 MG tablet; Take 1 tablet (10 mg total) by mouth daily.  Dispense: 90 tablet; Refill: 3  2. Primary insomnia May continue ambien 10mg  at bedtime as needed for insomnia.  - zolpidem (AMBIEN) 10 MG tablet; Take 1 tablet (10 mg total) by mouth at bedtime as needed for sleep.  Dispense: 30 tablet; Refill: 0  3. Encounter for long-term (current) use of medications - POCT Urine Drug Screen negative for all controlled substances. Appropriate, as patient does not take alprazolam regularly. Last prescription written 07/29/2017.   General Counseling: dashae wilcher understanding of  the findings of todays visit and agrees with plan of treatment. I have discussed any further diagnostic evaluation that may be needed or ordered today. We also reviewed her medications today. she has been encouraged to call the office with any questions or concerns that should arise related to todays visit.  Reviewed risks and possible side effects associated with taking opiates, benzodiazepines and other CNS depressants. Combination of these could cause dizziness and drowsiness. Advised patient not to drive or operate machinery when taking these medications, as patient's and other's life can be at risk and will have consequences. Patient verbalized understanding in this matter. Dependence and abuse for these drugs will be monitored closely. A Controlled substance policy and procedure is on file which allows [facility] to order a urine drug screen test at any visit. Patient understands and agrees with the plan  This patient was seen by Leretha Pol FNP Collaboration with Dr Lavera Guise as a part of collaborative care agreement  Orders Placed This Encounter  Procedures  . POCT Urine Drug Screen    Meds ordered this encounter  Medications  . ALPRAZolam (XANAX) 0.5 MG tablet    Sig: Take 1 tablet (0.5 mg total) by mouth daily as needed.    Dispense:  30 tablet    Refill:  3    Order Specific Question:   Supervising Provider    Answer:   Lavera Guise [[id number]]  . citalopram (CELEXA) 10 MG tablet    Sig: Take 1 tablet (10 mg total) by mouth daily.    Dispense:  90 tablet    Refill:  3    FOR NEXT FILL THANKS    Order Specific Question:   Supervising Provider    Answer:   Lavera Guise [[id number]]  .  zolpidem (AMBIEN) 10 MG tablet    Sig: Take 1 tablet (10 mg total) by mouth at bedtime as needed for sleep.    Dispense:  30 tablet    Refill:  0    Order Specific Question:   Supervising Provider    Answer:   Lavera Guise [[id number]]    Time spent: 76 Minutes      Dr Lavera Guise Internal medicine

## 2018-08-27 DIAGNOSIS — M5440 Lumbago with sciatica, unspecified side: Secondary | ICD-10-CM | POA: Diagnosis not present

## 2018-09-02 DIAGNOSIS — M545 Low back pain: Secondary | ICD-10-CM | POA: Diagnosis not present

## 2018-09-08 DIAGNOSIS — M545 Low back pain: Secondary | ICD-10-CM | POA: Diagnosis not present

## 2018-09-10 DIAGNOSIS — M545 Low back pain: Secondary | ICD-10-CM | POA: Diagnosis not present

## 2018-09-11 ENCOUNTER — Telehealth: Payer: Self-pay

## 2018-09-11 ENCOUNTER — Other Ambulatory Visit: Payer: Self-pay | Admitting: Nurse Practitioner

## 2018-09-11 DIAGNOSIS — M5116 Intervertebral disc disorders with radiculopathy, lumbar region: Secondary | ICD-10-CM

## 2018-09-11 MED ORDER — METAXALONE 800 MG PO TABS: 800.0000 mg | ORAL_TABLET | Freq: Every evening | ORAL | 0 refills | Status: DC | PRN

## 2018-09-11 NOTE — Progress Notes (Signed)
Patient c/o back injury with severe muscle spasms. Neurosurgeon out of office due to virus concerns. Filled her skelaxin to take at bedtime as needed. Sent #30 [facility] refills [facility].

## 2018-09-11 NOTE — Telephone Encounter (Signed)
I see she has been on skelaxin in the past. I filled this for her to take at bedtime as needed for muscle pain. Can be taken up to twice daily, however, may cause drowsiness.  Sent #30 with [facility] refills to [facility].

## 2018-09-11 NOTE — Telephone Encounter (Signed)
Pt was advised.

## 2018-09-15 DIAGNOSIS — M545 Low back pain: Secondary | ICD-10-CM | POA: Diagnosis not present

## 2018-09-17 DIAGNOSIS — M545 Low back pain: Secondary | ICD-10-CM | POA: Diagnosis not present

## 2018-09-22 DIAGNOSIS — M545 Low back pain: Secondary | ICD-10-CM | POA: Diagnosis not present

## 2018-09-24 DIAGNOSIS — M545 Low back pain: Secondary | ICD-10-CM | POA: Diagnosis not present

## 2018-09-29 DIAGNOSIS — M545 Low back pain: Secondary | ICD-10-CM | POA: Diagnosis not present

## 2018-10-05 ENCOUNTER — Other Ambulatory Visit: Payer: Self-pay

## 2018-10-05 ENCOUNTER — Telehealth: Payer: Self-pay

## 2018-10-05 DIAGNOSIS — F411 Generalized anxiety disorder: Secondary | ICD-10-CM

## 2018-10-05 MED ORDER — CITALOPRAM HYDROBROMIDE 10 MG PO TABS
ORAL_TABLET | ORAL | 0 refills | Status: DC
Start: 2018-10-05 — End: 2018-12-21

## 2018-10-05 NOTE — Telephone Encounter (Signed)
Pt called that she taking citalopram 10 take 2 tab daily as per heather send med

## 2018-10-06 DIAGNOSIS — M545 Low back pain: Secondary | ICD-10-CM | POA: Diagnosis not present

## 2018-10-08 DIAGNOSIS — M545 Low back pain: Secondary | ICD-10-CM | POA: Diagnosis not present

## 2018-10-09 ENCOUNTER — Other Ambulatory Visit: Payer: Self-pay | Admitting: Nurse Practitioner

## 2018-10-09 DIAGNOSIS — F5101 Primary insomnia: Secondary | ICD-10-CM

## 2018-10-09 MED ORDER — ZOLPIDEM TARTRATE 10 MG PO TABS: 10.0000 mg | ORAL_TABLET | Freq: Every evening | ORAL | 3 refills | Status: DC | PRN

## 2018-10-09 NOTE — Progress Notes (Signed)
Approved prescription for ambien 10mg  qhs prn per pharmacy request

## 2018-10-19 DIAGNOSIS — M5136 Other intervertebral disc degeneration, lumbar region: Secondary | ICD-10-CM | POA: Diagnosis not present

## 2018-10-22 DIAGNOSIS — M5440 Lumbago with sciatica, unspecified side: Secondary | ICD-10-CM | POA: Diagnosis not present

## 2018-10-27 DIAGNOSIS — M545 Low back pain: Secondary | ICD-10-CM | POA: Diagnosis not present

## 2018-10-29 DIAGNOSIS — M545 Low back pain: Secondary | ICD-10-CM | POA: Diagnosis not present

## 2018-11-03 DIAGNOSIS — M545 Low back pain: Secondary | ICD-10-CM | POA: Diagnosis not present

## 2018-11-05 DIAGNOSIS — M545 Low back pain: Secondary | ICD-10-CM | POA: Diagnosis not present

## 2018-11-09 DIAGNOSIS — L820 Inflamed seborrheic keratosis: Secondary | ICD-10-CM | POA: Diagnosis not present

## 2018-11-12 DIAGNOSIS — M545 Low back pain: Secondary | ICD-10-CM | POA: Diagnosis not present

## 2018-11-17 DIAGNOSIS — M545 Low back pain: Secondary | ICD-10-CM | POA: Diagnosis not present

## 2018-11-19 DIAGNOSIS — M545 Low back pain: Secondary | ICD-10-CM | POA: Diagnosis not present

## 2018-11-24 DIAGNOSIS — M545 Low back pain: Secondary | ICD-10-CM | POA: Diagnosis not present

## 2018-12-01 DIAGNOSIS — M545 Low back pain: Secondary | ICD-10-CM | POA: Diagnosis not present

## 2018-12-04 ENCOUNTER — Ambulatory Visit: Payer: Self-pay | Admitting: Nurse Practitioner

## 2018-12-08 DIAGNOSIS — M545 Low back pain: Secondary | ICD-10-CM | POA: Diagnosis not present

## 2018-12-10 DIAGNOSIS — L570 Actinic keratosis: Secondary | ICD-10-CM | POA: Diagnosis not present

## 2018-12-10 DIAGNOSIS — L812 Freckles: Secondary | ICD-10-CM | POA: Diagnosis not present

## 2018-12-10 DIAGNOSIS — D485 Neoplasm of uncertain behavior of skin: Secondary | ICD-10-CM | POA: Diagnosis not present

## 2018-12-10 DIAGNOSIS — M545 Low back pain: Secondary | ICD-10-CM | POA: Diagnosis not present

## 2018-12-15 DIAGNOSIS — M545 Low back pain: Secondary | ICD-10-CM | POA: Diagnosis not present

## 2018-12-17 DIAGNOSIS — M545 Low back pain: Secondary | ICD-10-CM | POA: Diagnosis not present

## 2018-12-21 ENCOUNTER — Other Ambulatory Visit: Payer: Self-pay

## 2018-12-21 ENCOUNTER — Ambulatory Visit: Payer: BC Managed Care – PPO | Admitting: Nurse Practitioner

## 2018-12-21 ENCOUNTER — Encounter: Payer: Self-pay | Admitting: Nurse Practitioner

## 2018-12-21 VITALS — BP 96/63 | HR 63 | Resp 16 | Ht 64.0 in | Wt 139.2 lb

## 2018-12-21 DIAGNOSIS — F411 Generalized anxiety disorder: Secondary | ICD-10-CM

## 2018-12-21 DIAGNOSIS — M5116 Intervertebral disc disorders with radiculopathy, lumbar region: Secondary | ICD-10-CM | POA: Diagnosis not present

## 2018-12-21 DIAGNOSIS — F5101 Primary insomnia: Secondary | ICD-10-CM | POA: Diagnosis not present

## 2018-12-21 DIAGNOSIS — Z79899 Other long term (current) drug therapy: Secondary | ICD-10-CM

## 2018-12-21 LAB — POCT URINE DRUG SCREEN
POC Amphetamine UR: NOT DETECTED
POC BENZODIAZEPINES UR: NOT DETECTED
POC Barbiturate UR: NOT DETECTED
POC Cocaine UR: NOT DETECTED
POC Ecstasy UR: NOT DETECTED
POC Marijuana UR: NOT DETECTED
POC Methadone UR: NOT DETECTED
POC Methamphetamine UR: NOT DETECTED
POC Opiate Ur: NOT DETECTED
POC Oxycodone UR: NOT DETECTED
POC PHENCYCLIDINE UR: NOT DETECTED
POC TRICYCLICS UR: NOT DETECTED

## 2018-12-21 MED ORDER — CITALOPRAM HYDROBROMIDE 10 MG PO TABS
20.0000 mg | ORAL_TABLET | Freq: Every day | ORAL | 1 refills | Status: DC
Start: 2018-12-21 — End: 2019-06-24

## 2018-12-21 MED ORDER — ZOLPIDEM TARTRATE 10 MG PO TABS: 10.0000 mg | ORAL_TABLET | Freq: Every evening | ORAL | 3 refills | Status: DC | PRN

## 2018-12-21 NOTE — Progress Notes (Signed)
[facility] [place], [place] [address]  Internal MEDICINE  Office Visit Note  Patient Name: Holly Le  [id number]  [id number]  Date of Service: 12/26/2018  Chief Complaint  Patient presents with  . Depression  . Anxiety  . Graves' Disease    The patient is here for routine follow up. She suffers from generalized anxiety disorder and depression. Takes citalopram every day which manages symptoms very well. She takes her alprazolam only on rare occassions when needed. She continues to take zolpidem at night to help her sleep. She does need to have refills for this today.       Current Medication: Outpatient Encounter Medications as of 12/21/2018  Medication Sig  . ALPRAZolam (XANAX) 0.5 MG tablet Take 1 tablet (0.5 mg total) by mouth daily as needed.  . citalopram (CELEXA) 10 MG tablet Take 2 tablets (20 mg total) by mouth daily.  . metaxalone (SKELAXIN) 800 MG tablet Take 1 tablet (800 mg total) by mouth at bedtime as needed for muscle spasms.  . ondansetron (ZOFRAN-ODT) 4 MG disintegrating tablet Take 1 tablet (4 mg total) by mouth every 8 (eight) hours as needed for nausea or vomiting.  Marland Kitchen zolpidem (AMBIEN) 10 MG tablet Take 1 tablet (10 mg total) by mouth at bedtime as needed for sleep.  . [DISCONTINUED] citalopram (CELEXA) 10 MG tablet Take 2 tab po daily  . [DISCONTINUED] zolpidem (AMBIEN) 10 MG tablet Take 1 tablet (10 mg total) by mouth at bedtime as needed for sleep.   No facility-administered encounter medications on file as of 12/21/2018.     Surgical History: Past Surgical History:  Procedure Laterality Date  . back injection    . CESAREAN SECTION     twins  . FOOT SURGERY    . LITHOTRIPSY    . SHOULDER SURGERY  01/2009   RIGHT    Medical History: Past Medical History:  Diagnosis Date  . Anxiety   . Depression   . Grave's disease     Family History: Family History  Problem Relation Age of Onset  . Lymphoma Father   .  Lymphoma Paternal Aunt   . Cancer Maternal Grandfather        COLON  . Breast cancer Paternal Aunt     Social History   Socioeconomic History  . Marital status: Married    Spouse name: Not on file  . Number of children: Not on file  . Years of education: Not on file  . Highest education level: Not on file  Occupational History  . Not on file  Social Needs  . Financial resource strain: Not on file  . Food insecurity    Worry: Not on file    Inability: Not on file  . Transportation needs    Medical: Not on file    Non-medical: Not on file  Tobacco Use  . Smoking status: Never Smoker  . Smokeless tobacco: Never Used  Substance and Sexual Activity  . Alcohol use: Yes    Comment: occasionally  . Drug use: No  . Sexual activity: Not on file  Lifestyle  . Physical activity    Days per week: Not on file    Minutes per session: Not on file  . Stress: Not on file  Relationships  . Social Herbalist on phone: Not on file    Gets together: Not on file    Attends religious service: Not on file    Active member  of club or organization: Not on file    Attends meetings of clubs or organizations: Not on file    Relationship status: Not on file  . Intimate partner violence    Fear of current or ex partner: Not on file    Emotionally abused: Not on file    Physically abused: Not on file    Forced sexual activity: Not on file  Other Topics Concern  . Not on file  Social History Narrative  . Not on file      Review of Systems  Constitutional: Negative for activity change, chills, fatigue and unexpected weight change.  HENT: Negative for congestion, postnasal drip, rhinorrhea, sneezing and sore throat.   Respiratory: Negative for cough, chest tightness, shortness of breath and wheezing.   Cardiovascular: Negative for chest pain and palpitations.  Gastrointestinal: Positive for constipation. Negative for abdominal pain, diarrhea, nausea and vomiting.  Endocrine:  Negative for cold intolerance, heat intolerance, polydipsia and polyuria.  Musculoskeletal: Positive for arthralgias and back pain. Negative for joint swelling and neck pain.  Skin: Negative for rash.  Allergic/Immunologic: Positive for environmental allergies.  Neurological: Negative for dizziness, tremors, numbness and headaches.  Hematological: Negative for adenopathy. Does not bruise/bleed easily.  Psychiatric/Behavioral: Positive for dysphoric mood and sleep disturbance. Negative for behavioral problems (Depression) and suicidal ideas. The patient is nervous/anxious.        Takes zolpidem 10mg  at night when needed for insomnia. She does need to have refill today    Today's Vitals   12/21/18 1602  BP: 96/63  Pulse: 63  Resp: 16  SpO2: 100%  Weight: 139 lb 3.2 oz (63.1 kg)  Height: 5\' 4"  (1.626 m)   Body mass index is 23.89 kg/m.  Physical Exam Vitals signs and nursing note reviewed.  Constitutional:      General: She is not in acute distress.    Appearance: Normal appearance. She is well-developed. She is not diaphoretic.  HENT:     Head: Normocephalic and atraumatic.     Nose: Nose normal.     Mouth/Throat:     Pharynx: No oropharyngeal exudate.  Eyes:     Conjunctiva/sclera: Conjunctivae normal.     Pupils: Pupils are equal, round, and reactive to light.  Neck:     Musculoskeletal: Normal range of motion and neck supple.     Thyroid: No thyromegaly.     Vascular: No carotid bruit or JVD.     Trachea: No tracheal deviation.  Cardiovascular:     Rate and Rhythm: Normal rate and regular rhythm.     Heart sounds: Normal heart sounds. No murmur. No friction rub. No gallop.   Pulmonary:     Effort: Pulmonary effort is normal. No respiratory distress.     Breath sounds: Normal breath sounds. No wheezing or rales.  Chest:     Chest wall: No tenderness.     Breasts:        Right: No inverted nipple, mass, nipple discharge, skin change or tenderness.        Left: No  inverted nipple, mass, nipple discharge, skin change or tenderness.  Abdominal:     General: Bowel sounds are normal.     Palpations: Abdomen is soft.     Tenderness: There is no abdominal tenderness.  Musculoskeletal: Normal range of motion.  Lymphadenopathy:     Cervical: No cervical adenopathy.  Skin:    General: Skin is warm and dry.     Capillary Refill: Capillary refill takes less  than 2 seconds.  Neurological:     Mental Status: She is alert and oriented to person, place, and time.     Cranial Nerves: No cranial nerve deficit.  Psychiatric:        Mood and Affect: Mood is anxious.        Speech: Speech normal.        Behavior: Behavior normal.        Thought Content: Thought content normal.        Judgment: Judgment normal.   Assessment/Plan: 1. Lumbar disc disease with radiculopathy Continue skelaxin as previously prescribed. Regular visits with chiropractor and orthopedics as indicated.   2. Generalized anxiety disorder Continue celexa as prescribed. Refills provided today.  - citalopram (CELEXA) 10 MG tablet; Take 2 tablets (20 mg total) by mouth daily.  Dispense: 180 tablet; Refill: 1  3. Primary insomnia May continue zolpidem 10mg  at bedtime as needed for insomnia. Refills were provided today.  - zolpidem (AMBIEN) 10 MG tablet; Take 1 tablet (10 mg total) by mouth at bedtime as needed for sleep.  Dispense: 30 tablet; Refill: 3  4. Encounter for long-term (current) use of medications - POCT Urine Drug Screen appropriately negative for all controlled substances.   General Counseling: mitali shenefield understanding of the findings of todays visit and agrees with plan of treatment. I have discussed any further diagnostic evaluation that may be needed or ordered today. We also reviewed her medications today. she has been encouraged to call the office with any questions or concerns that should arise related to todays visit.  This patient was seen by Leretha Pol FNP  Collaboration with Dr Lavera Guise as a part of collaborative care agreement  Orders Placed This Encounter  Procedures  . POCT Urine Drug Screen    Meds ordered this encounter  Medications  . citalopram (CELEXA) 10 MG tablet    Sig: Take 2 tablets (20 mg total) by mouth daily.    Dispense:  180 tablet    Refill:  1    FOR NEXT FILL THANKS    Order Specific Question:   Supervising Provider    Answer:   Lavera Guise [[id number]]  . zolpidem (AMBIEN) 10 MG tablet    Sig: Take 1 tablet (10 mg total) by mouth at bedtime as needed for sleep.    Dispense:  30 tablet    Refill:  3    Order Specific Question:   Supervising Provider    Answer:   Lavera Guise [[id number]]    Time spent: 27 Minutes      Dr Lavera Guise Internal medicine

## 2018-12-22 DIAGNOSIS — M545 Low back pain: Secondary | ICD-10-CM | POA: Diagnosis not present

## 2018-12-24 DIAGNOSIS — M545 Low back pain: Secondary | ICD-10-CM | POA: Diagnosis not present

## 2019-01-07 DIAGNOSIS — M545 Low back pain: Secondary | ICD-10-CM | POA: Diagnosis not present

## 2019-01-19 DIAGNOSIS — M545 Low back pain: Secondary | ICD-10-CM | POA: Diagnosis not present

## 2019-01-21 DIAGNOSIS — M545 Low back pain: Secondary | ICD-10-CM | POA: Diagnosis not present

## 2019-01-27 DIAGNOSIS — M545 Low back pain: Secondary | ICD-10-CM | POA: Diagnosis not present

## 2019-01-28 DIAGNOSIS — M545 Low back pain: Secondary | ICD-10-CM | POA: Diagnosis not present

## 2019-02-02 DIAGNOSIS — M545 Low back pain: Secondary | ICD-10-CM | POA: Diagnosis not present

## 2019-02-04 DIAGNOSIS — M545 Low back pain: Secondary | ICD-10-CM | POA: Diagnosis not present

## 2019-03-04 DIAGNOSIS — M545 Low back pain: Secondary | ICD-10-CM | POA: Diagnosis not present

## 2019-03-09 DIAGNOSIS — M545 Low back pain: Secondary | ICD-10-CM | POA: Diagnosis not present

## 2019-03-16 DIAGNOSIS — M545 Low back pain: Secondary | ICD-10-CM | POA: Diagnosis not present

## 2019-03-23 DIAGNOSIS — M545 Low back pain: Secondary | ICD-10-CM | POA: Diagnosis not present

## 2019-03-25 DIAGNOSIS — M545 Low back pain: Secondary | ICD-10-CM | POA: Diagnosis not present

## 2019-03-30 DIAGNOSIS — M545 Low back pain: Secondary | ICD-10-CM | POA: Diagnosis not present

## 2019-04-13 DIAGNOSIS — M545 Low back pain: Secondary | ICD-10-CM | POA: Diagnosis not present

## 2019-04-15 DIAGNOSIS — M545 Low back pain: Secondary | ICD-10-CM | POA: Diagnosis not present

## 2019-04-27 ENCOUNTER — Other Ambulatory Visit: Payer: Self-pay | Admitting: Nurse Practitioner

## 2019-04-28 ENCOUNTER — Other Ambulatory Visit: Payer: Self-pay | Admitting: Nurse Practitioner

## 2019-05-05 DIAGNOSIS — R519 Headache, unspecified: Secondary | ICD-10-CM | POA: Diagnosis not present

## 2019-05-05 DIAGNOSIS — Z20828 Contact with and (suspected) exposure to other viral communicable diseases: Secondary | ICD-10-CM | POA: Diagnosis not present

## 2019-05-10 ENCOUNTER — Other Ambulatory Visit: Payer: Self-pay | Admitting: Internal Medicine

## 2019-05-10 DIAGNOSIS — F5101 Primary insomnia: Secondary | ICD-10-CM

## 2019-05-10 MED ORDER — ZOLPIDEM TARTRATE 10 MG PO TABS: 10.0000 mg | ORAL_TABLET | Freq: Every evening | ORAL | 0 refills | Status: DC | PRN

## 2019-05-27 ENCOUNTER — Telehealth: Payer: Self-pay

## 2019-05-27 NOTE — Telephone Encounter (Signed)
RESCHEDULED PATIENTS APPOINTMENT FOR 06-24-19 DUE TO COVID RESTRICTIONS.

## 2019-05-31 ENCOUNTER — Other Ambulatory Visit: Payer: Self-pay | Admitting: Nurse Practitioner

## 2019-06-22 ENCOUNTER — Telehealth: Payer: Self-pay

## 2019-06-22 NOTE — Telephone Encounter (Signed)
LMOM FOR PATIENT TO SCREEN AND CONFIRM 06-24-19 OV.

## 2019-06-23 ENCOUNTER — Telehealth: Payer: Self-pay

## 2019-06-23 NOTE — Telephone Encounter (Signed)
Confirmed 06-24-19 appt as virtual

## 2019-06-24 ENCOUNTER — Ambulatory Visit: Payer: BC Managed Care – PPO | Admitting: Nurse Practitioner

## 2019-06-24 ENCOUNTER — Encounter: Payer: Self-pay | Admitting: Nurse Practitioner

## 2019-06-24 ENCOUNTER — Other Ambulatory Visit: Payer: Self-pay

## 2019-06-24 VITALS — Temp 98.0°F | Resp 16 | Ht 64.0 in | Wt 135.0 lb

## 2019-06-24 DIAGNOSIS — F411 Generalized anxiety disorder: Secondary | ICD-10-CM

## 2019-06-24 DIAGNOSIS — F5101 Primary insomnia: Secondary | ICD-10-CM

## 2019-06-24 DIAGNOSIS — R5383 Other fatigue: Secondary | ICD-10-CM | POA: Diagnosis not present

## 2019-06-24 MED ORDER — ZOLPIDEM TARTRATE 10 MG PO TABS: 10.0000 mg | ORAL_TABLET | Freq: Every evening | ORAL | 3 refills | Status: DC | PRN

## 2019-06-24 MED ORDER — CITALOPRAM HYDROBROMIDE 10 MG PO TABS: 20.0000 mg | ORAL_TABLET | Freq: Every day | ORAL | 1 refills | Status: DC

## 2019-06-24 NOTE — Progress Notes (Signed)
[facility] [place], [place] [address]  Internal MEDICINE  Telephone Visit  Patient Name: Holly Le  [id number]  [id number]  Date of Service: 06/30/2019  I connected with the patient at 11:24am by telephone and verified the patients identity using two identifiers.   I discussed the limitations, risks, security and privacy concerns of performing an evaluation and management service by telephone and the availability of in person appointments. I also discussed with the patient that there may be a patient responsible charge related to the service.  The patient expressed understanding and agrees to proceed.    Chief Complaint  Patient presents with  . Telephone Assessment  . Telephone Screen  . Sore Throat  . Depression    The patient has been contacted via telephone for follow up visit due to concerns for spread of novel coronavirus. The patient presents for follow-up. She suffers from generalized anxiety disorder and depression. Takes citalopram every day which manages symptoms very well. She takes her alprazolam only on rare occassions when needed. She continues to take zolpidem at night to help her sleep. She does need to have refills for this today.        Current Medication: Outpatient Encounter Medications as of 06/24/2019  Medication Sig  . ALPRAZolam (XANAX) 0.5 MG tablet Take 1 tablet (0.5 mg total) by mouth daily as needed.  . citalopram (CELEXA) 10 MG tablet Take 2 tablets (20 mg total) by mouth daily.  . metaxalone (SKELAXIN) 800 MG tablet Take 1 tablet (800 mg total) by mouth at bedtime as needed for muscle spasms.  . ondansetron (ZOFRAN-ODT) 4 MG disintegrating tablet Take 1 tablet (4 mg total) by mouth every 8 (eight) hours as needed for nausea or vomiting.  Marland Kitchen zolpidem (AMBIEN) 10 MG tablet Take 1 tablet (10 mg total) by mouth at bedtime as needed for sleep.  . [DISCONTINUED] citalopram (CELEXA) 10 MG tablet Take 2 tablets (20 mg total) by  mouth daily.  . [DISCONTINUED] zolpidem (AMBIEN) 10 MG tablet Take 1 tablet (10 mg total) by mouth at bedtime as needed for sleep.   No facility-administered encounter medications on file as of 06/24/2019.    Surgical History: Past Surgical History:  Procedure Laterality Date  . back injection    . CESAREAN SECTION     twins  . FOOT SURGERY    . LITHOTRIPSY    . SHOULDER SURGERY  01/2009   RIGHT    Medical History: Past Medical History:  Diagnosis Date  . Anxiety   . Depression   . Grave's disease     Family History: Family History  Problem Relation Age of Onset  . Lymphoma Father   . Lymphoma Paternal Aunt   . Cancer Maternal Grandfather        COLON  . Breast cancer Paternal Aunt     Social History   Socioeconomic History  . Marital status: Married    Spouse name: Not on file  . Number of children: Not on file  . Years of education: Not on file  . Highest education level: Not on file  Occupational History  . Not on file  Tobacco Use  . Smoking status: Never Smoker  . Smokeless tobacco: Never Used  Substance and Sexual Activity  . Alcohol use: Yes    Comment: occasionally  . Drug use: No  . Sexual activity: Not on file  Other Topics Concern  . Not on file  Social History Narrative  . Not  on file   Social Determinants of Health   Financial Resource Strain:   . Difficulty of Paying Living Expenses: Not on file  Food Insecurity:   . Worried About Charity fundraiser in the Last Year: Not on file  . Ran Out of Food in the Last Year: Not on file  Transportation Needs:   . Lack of Transportation (Medical): Not on file  . Lack of Transportation (Non-Medical): Not on file  Physical Activity:   . Days of Exercise per Week: Not on file  . Minutes of Exercise per Session: Not on file  Stress:   . Feeling of Stress : Not on file  Social Connections:   . Frequency of Communication with Friends and Family: Not on file  . Frequency of Social Gatherings  with Friends and Family: Not on file  . Attends Religious Services: Not on file  . Active Member of Clubs or Organizations: Not on file  . Attends Archivist Meetings: Not on file  . Marital Status: Not on file  Intimate Partner Violence:   . Fear of Current or Ex-Partner: Not on file  . Emotionally Abused: Not on file  . Physically Abused: Not on file  . Sexually Abused: Not on file      Review of Systems  Constitutional: Negative for activity change, chills, fatigue and unexpected weight change.  HENT: Negative for congestion, postnasal drip, rhinorrhea, sneezing and sore throat.   Respiratory: Negative for cough, chest tightness, shortness of breath and wheezing.   Cardiovascular: Negative for chest pain and palpitations.  Gastrointestinal: Positive for constipation. Negative for abdominal pain, diarrhea, nausea and vomiting.  Endocrine: Negative for cold intolerance, heat intolerance, polydipsia and polyuria.  Musculoskeletal: Positive for arthralgias and back pain. Negative for joint swelling and neck pain.  Skin: Negative for rash.  Allergic/Immunologic: Positive for environmental allergies.  Neurological: Negative for dizziness, tremors, numbness and headaches.  Hematological: Negative for adenopathy. Does not bruise/bleed easily.  Psychiatric/Behavioral: Positive for dysphoric mood and sleep disturbance. Negative for behavioral problems (Depression) and suicidal ideas. The patient is nervous/anxious.        Takes zolpidem 10mg  at night when needed for insomnia. She does need to have refill today    Today's Vitals   06/24/19 1052  Resp: 16  Temp: 98 F (36.7 C)  Weight: 135 lb (61.2 kg)  Height: 5\' 4"  (1.626 m)   Body mass index is 23.17 kg/m.  Observation/Objective:   The patient is alert and oriented. She is pleasant and answers all questions appropriately. Breathing is non-labored. She is in no acute distress at this time.   Assessment/Plan:  1.  Other fatigue Likely related to busy work schedule. Will monitor  2. Generalized anxiety disorder Will continue citalopram 10mg  taking two tablets daily.  - citalopram (CELEXA) 10 MG tablet; Take 2 tablets (20 mg total) by mouth daily.  Dispense: 180 tablet; Refill: 1  3. Primary insomniamay take ambien 10mg  at bedtime as needed for insomnia.  - zolpidem (AMBIEN) 10 MG tablet; Take 1 tablet (10 mg total) by mouth at bedtime as needed for sleep.  Dispense: 30 tablet; Refill: 3   General Counseling: Lucynda verbalizes understanding of the findings of today's phone visit and agrees with plan of treatment. I have discussed any further diagnostic evaluation that may be needed or ordered today. We also reviewed her medications today. she has been encouraged to call the office with any questions or concerns that should arise related to todays  visit.   This patient was seen by Leretha Pol FNP Collaboration with Dr Lavera Guise as a part of collaborative care agreement  Meds ordered this encounter  Medications  . citalopram (CELEXA) 10 MG tablet    Sig: Take 2 tablets (20 mg total) by mouth daily.    Dispense:  180 tablet    Refill:  1    FOR NEXT FILL THANKS    Order Specific Question:   Supervising Provider    Answer:   Lavera Guise [id number]  . zolpidem (AMBIEN) 10 MG tablet    Sig: Take 1 tablet (10 mg total) by mouth at bedtime as needed for sleep.    Dispense:  30 tablet    Refill:  3    Order Specific Question:   Supervising Provider    Answer:   Lavera Guise [id number]    Time spent: 35 Minutes    Dr Lavera Guise Internal medicine

## 2019-06-30 DIAGNOSIS — R5383 Other fatigue: Secondary | ICD-10-CM | POA: Insufficient documentation

## 2019-08-27 DIAGNOSIS — L853 Xerosis cutis: Secondary | ICD-10-CM | POA: Diagnosis not present

## 2019-08-27 DIAGNOSIS — L400 Psoriasis vulgaris: Secondary | ICD-10-CM | POA: Diagnosis not present

## 2019-08-27 DIAGNOSIS — L299 Pruritus, unspecified: Secondary | ICD-10-CM | POA: Diagnosis not present

## 2019-08-27 DIAGNOSIS — D225 Melanocytic nevi of trunk: Secondary | ICD-10-CM | POA: Diagnosis not present

## 2019-09-08 DIAGNOSIS — Z23 Encounter for immunization: Secondary | ICD-10-CM | POA: Diagnosis not present

## 2019-09-22 ENCOUNTER — Telehealth: Payer: Self-pay

## 2019-09-22 NOTE — Telephone Encounter (Signed)
Confirmed and screened for 09-27-19 ov.

## 2019-09-27 ENCOUNTER — Ambulatory Visit (INDEPENDENT_AMBULATORY_CARE_PROVIDER_SITE_OTHER): Payer: BC Managed Care – PPO | Admitting: Nurse Practitioner

## 2019-09-27 ENCOUNTER — Encounter: Payer: Self-pay | Admitting: Nurse Practitioner

## 2019-09-27 ENCOUNTER — Other Ambulatory Visit: Payer: Self-pay | Admitting: Nurse Practitioner

## 2019-09-27 ENCOUNTER — Other Ambulatory Visit: Payer: Self-pay

## 2019-09-27 VITALS — BP 119/82 | HR 53 | Temp 97.3°F | Resp 16 | Ht 64.0 in | Wt 140.2 lb

## 2019-09-27 DIAGNOSIS — R5383 Other fatigue: Secondary | ICD-10-CM

## 2019-09-27 DIAGNOSIS — E559 Vitamin D deficiency, unspecified: Secondary | ICD-10-CM | POA: Diagnosis not present

## 2019-09-27 DIAGNOSIS — N926 Irregular menstruation, unspecified: Secondary | ICD-10-CM | POA: Diagnosis not present

## 2019-09-27 DIAGNOSIS — R300 Dysuria: Secondary | ICD-10-CM

## 2019-09-27 DIAGNOSIS — Z8742 Personal history of other diseases of the female genital tract: Secondary | ICD-10-CM | POA: Diagnosis not present

## 2019-09-27 DIAGNOSIS — Z124 Encounter for screening for malignant neoplasm of cervix: Secondary | ICD-10-CM

## 2019-09-27 DIAGNOSIS — Z1231 Encounter for screening mammogram for malignant neoplasm of breast: Secondary | ICD-10-CM

## 2019-09-27 DIAGNOSIS — F5101 Primary insomnia: Secondary | ICD-10-CM

## 2019-09-27 DIAGNOSIS — Z0001 Encounter for general adult medical examination with abnormal findings: Secondary | ICD-10-CM

## 2019-09-27 DIAGNOSIS — F411 Generalized anxiety disorder: Secondary | ICD-10-CM

## 2019-09-27 MED ORDER — ALPRAZOLAM 0.5 MG PO TABS: 0.5000 mg | ORAL_TABLET | Freq: Every day | ORAL | 3 refills | Status: DC | PRN

## 2019-09-27 MED ORDER — ZOLPIDEM TARTRATE 10 MG PO TABS: 10.0000 mg | ORAL_TABLET | Freq: Every evening | ORAL | 3 refills | Status: DC | PRN

## 2019-09-27 NOTE — Progress Notes (Signed)
[facility] [place], [place] [address]  Internal MEDICINE  Office Visit Note  Patient Name: Holly Le  [id number]  [id number]  Date of Service: 09/27/2019   Pt is here for routine health maintenance examination   Chief Complaint  Patient presents with  . Annual Exam  . Gynecologic Exam  . Hypothyroidism  . Anxiety  . Depression  . Neck Pain  . Medication Refill    alprazolam     The patient is here for health maintenance exam and pap smear. She is reporting some neck pain and tension. She believes this is related to stress and tension from work. She is continuing to work from home due to pandemic. She is putting in 13 hour days. Discussed some ways to cope with and manage stress. She does take citalopram which helps her to cope with the stress she does have. She is taking ambien most nights to help with sleep and will take alprazolam as needed for acute anxiety.  Last time prescription given for alprazolam was 07/2018.  She states that she has been having very irregular menstrual cycles. Will go several months then have a short period, then go a few more months before she has another one. She also has had a few, intermittent hot flashes.   Current Medication: Outpatient Encounter Medications as of 09/27/2019  Medication Sig  . ALPRAZolam (XANAX) 0.5 MG tablet Take 1 tablet (0.5 mg total) by mouth daily as needed.  . citalopram (CELEXA) 10 MG tablet Take 2 tablets (20 mg total) by mouth daily.  Marland Kitchen zolpidem (AMBIEN) 10 MG tablet Take 1 tablet (10 mg total) by mouth at bedtime as needed for sleep.  . [DISCONTINUED] ALPRAZolam (XANAX) 0.5 MG tablet Take 1 tablet (0.5 mg total) by mouth daily as needed.  . [DISCONTINUED] zolpidem (AMBIEN) 10 MG tablet Take 1 tablet (10 mg total) by mouth at bedtime as needed for sleep.  . [DISCONTINUED] metaxalone (SKELAXIN) 800 MG tablet Take 1 tablet (800 mg total) by mouth at bedtime as needed for muscle spasms. (Patient  not taking: Reported on 09/27/2019)  . [DISCONTINUED] ondansetron (ZOFRAN-ODT) 4 MG disintegrating tablet Take 1 tablet (4 mg total) by mouth every 8 (eight) hours as needed for nausea or vomiting. (Patient not taking: Reported on 09/27/2019)   No facility-administered encounter medications on file as of 09/27/2019.    Surgical History: Past Surgical History:  Procedure Laterality Date  . back injection    . CESAREAN SECTION     twins  . FOOT SURGERY    . LITHOTRIPSY    . SHOULDER SURGERY  01/2009   RIGHT    Medical History: Past Medical History:  Diagnosis Date  . Anxiety   . Depression   . Grave's disease     Family History: Family History  Problem Relation Age of Onset  . Lymphoma Father   . Lymphoma Paternal Aunt   . Cancer Maternal Grandfather        COLON  . Breast cancer Paternal Aunt       Review of Systems  Constitutional: Negative for activity change, chills, fatigue and unexpected weight change.  HENT: Negative for congestion, postnasal drip, rhinorrhea, sneezing and sore throat.   Respiratory: Negative for cough, chest tightness, shortness of breath and wheezing.   Cardiovascular: Negative for chest pain and palpitations.  Gastrointestinal: Negative for abdominal pain, constipation, diarrhea, nausea and vomiting.  Endocrine: Positive for heat intolerance. Negative for cold intolerance, polydipsia and polyuria.  Genitourinary: Negative for dysuria, frequency and urgency.  Musculoskeletal: Negative for arthralgias, back pain, joint swelling and neck pain.  Skin: Negative for rash.  Allergic/Immunologic: Negative for environmental allergies.  Neurological: Negative for tremors, numbness and headaches.  Hematological: Negative for adenopathy. Does not bruise/bleed easily.  Psychiatric/Behavioral: Negative for behavioral problems (Depression), sleep disturbance and suicidal ideas. The patient is not nervous/anxious.      Today's Vitals   09/27/19 0933  BP:  119/82  Pulse: (!) 53  Resp: 16  Temp: (!) 97.3 F (36.3 C)  SpO2: 100%  Weight: 140 lb 3.2 oz (63.6 kg)  Height: 5\' 4"  (1.626 m)   Body mass index is 24.07 kg/m.  Physical Exam Vitals and nursing note reviewed.  Constitutional:      General: She is not in acute distress.    Appearance: Normal appearance. She is well-developed. She is not diaphoretic.  HENT:     Head: Normocephalic and atraumatic.     Nose: Nose normal.     Mouth/Throat:     Pharynx: No oropharyngeal exudate.  Eyes:     Pupils: Pupils are equal, round, and reactive to light.  Neck:     Thyroid: No thyromegaly.     Vascular: No JVD.     Trachea: No tracheal deviation.  Cardiovascular:     Rate and Rhythm: Normal rate and regular rhythm.     Pulses: Normal pulses.     Heart sounds: Normal heart sounds. No murmur. No friction rub. No gallop.   Pulmonary:     Effort: Pulmonary effort is normal. No respiratory distress.     Breath sounds: Normal breath sounds. No wheezing or rales.  Chest:     Chest wall: No tenderness.     Breasts:        Right: Normal. No swelling, bleeding, inverted nipple, mass, nipple discharge, skin change or tenderness.        Left: Normal. No swelling, bleeding, inverted nipple, mass, nipple discharge, skin change or tenderness.  Abdominal:     General: Bowel sounds are normal.     Palpations: Abdomen is soft.     Tenderness: There is no abdominal tenderness.  Genitourinary:    General: Normal vulva.     Labia:        Right: No tenderness.        Left: No tenderness.      Vagina: Normal. No vaginal discharge, erythema, tenderness or bleeding.     Cervix: No erythema.     Uterus: Normal.      Adnexa: Right adnexa normal.     Comments: No tenderness, masses, or organomeglay present during bimanual exam . Musculoskeletal:        General: Normal range of motion.     Cervical back: Normal range of motion and neck supple.  Lymphadenopathy:     Cervical: No cervical adenopathy.      Upper Body:     Right upper body: No axillary adenopathy.     Left upper body: No axillary adenopathy.     Lower Body: No right inguinal adenopathy. No left inguinal adenopathy.  Skin:    General: Skin is warm and dry.  Neurological:     Mental Status: She is alert and oriented to person, place, and time.     Cranial Nerves: No cranial nerve deficit.  Psychiatric:        Mood and Affect: Mood normal.        Behavior: Behavior normal.  Thought Content: Thought content normal.        Judgment: Judgment normal.    Assessment/Plan: 1. Encounter for general adult medical examination with abnormal findings Annual health mainenance exam with pap smear today.   2. History of irregular menstrual cycles Check reproductive hormones for further evaluation.   3. Other fatigue Check thyroid panel.   4. Generalized anxiety disorder May continue alprazolam 0.5mg  daily as needed for acute anxiety. New prescription given to her pharmacy.  - ALPRAZolam (XANAX) 0.5 MG tablet; Take 1 tablet (0.5 mg total) by mouth daily as needed.  Dispense: 30 tablet; Refill: 3  5. Primary insomnia May continue ambien as needed for insomnia.  - zolpidem (AMBIEN) 10 MG tablet; Take 1 tablet (10 mg total) by mouth at bedtime as needed for sleep.  Dispense: 30 tablet; Refill: 3  6. Routine cervical smear - IGP, Aptima HPV  7. Encounter for screening mammogram for malignant neoplasm of breast - MM DIGITAL SCREENING BILATERAL; Future  8. Dysuria - Urinalysis, Routine w reflex microscopic  General Counseling: Abcde verbalizes understanding of the findings of todays visit and agrees with plan of treatment. I have discussed any further diagnostic evaluation that may be needed or ordered today. We also reviewed her medications today. she has been encouraged to call the office with any questions or concerns that should arise related to todays visit.    Counseling:  This patient was seen by Leretha Pol FNP Collaboration with Dr Lavera Guise as a part of collaborative care agreement  Orders Placed This Encounter  Procedures  . MM DIGITAL SCREENING BILATERAL  . Urinalysis, Routine w reflex microscopic    Meds ordered this encounter  Medications  . ALPRAZolam (XANAX) 0.5 MG tablet    Sig: Take 1 tablet (0.5 mg total) by mouth daily as needed.    Dispense:  30 tablet    Refill:  3    Order Specific Question:   Supervising Provider    Answer:   Lavera Guise [id number]  . zolpidem (AMBIEN) 10 MG tablet    Sig: Take 1 tablet (10 mg total) by mouth at bedtime as needed for sleep.    Dispense:  30 tablet    Refill:  3    Order Specific Question:   Supervising Provider    Answer:   Lavera Guise [id number]    Total time spent: 27 Minutes  Time spent includes review of chart, medications, test results, and follow up plan with the patient.     Lavera Guise, MD  Internal Medicine

## 2019-09-28 LAB — COMPREHENSIVE METABOLIC PANEL
ALT: 12 IU/L (ref 0–32)
AST: 21 IU/L (ref 0–40)
Albumin/Globulin Ratio: 1.5 (ref 1.2–2.2)
Albumin: 4.4 g/dL (ref 3.8–4.9)
Alkaline Phosphatase: 87 IU/L (ref 39–117)
BUN/Creatinine Ratio: 18 (ref 9–23)
BUN: 15 mg/dL (ref 6–24)
Bilirubin Total: 0.4 mg/dL (ref 0.0–1.2)
CO2: 22 mmol/L (ref 20–29)
Calcium: 9.7 mg/dL (ref 8.7–10.2)
Chloride: 104 mmol/L (ref 96–106)
Creatinine, Ser: 0.83 mg/dL (ref 0.57–1.00)
GFR calc Af Amer: 94 mL/min/{1.73_m2} (ref >59)
GFR calc non Af Amer: 82 mL/min/{1.73_m2} (ref >59)
Globulin, Total: 3 g/dL (ref 1.5–4.5)
Glucose: 97 mg/dL (ref 65–99)
Potassium: 4.5 mmol/L (ref 3.5–5.2)
Sodium: 140 mmol/L (ref 134–144)
Total Protein: 7.4 g/dL (ref 6.0–8.5)

## 2019-09-28 LAB — URINALYSIS, ROUTINE W REFLEX MICROSCOPIC
Bilirubin, UA: NEGATIVE
Glucose, UA: NEGATIVE
Ketones, UA: NEGATIVE
Leukocytes,UA: NEGATIVE
Nitrite, UA: NEGATIVE
Protein,UA: NEGATIVE
RBC, UA: NEGATIVE
Specific Gravity, UA: 1.024 (ref 1.005–1.030)
Urobilinogen, Ur: 0.2 mg/dL (ref 0.2–1.0)
pH, UA: 6 (ref 5.0–7.5)

## 2019-09-28 LAB — FSH/LH
FSH: 94 m[IU]/mL
LH: 28.7 m[IU]/mL

## 2019-09-28 LAB — T4, FREE: Free T4: 0.98 ng/dL (ref 0.82–1.77)

## 2019-09-28 LAB — LIPID PANEL W/O CHOL/HDL RATIO
Cholesterol, Total: 213 mg/dL — ABNORMAL HIGH (ref 100–199)
HDL: 73 mg/dL (ref >39)
LDL Chol Calc (NIH): 120 mg/dL — ABNORMAL HIGH (ref 0–99)
Triglycerides: 114 mg/dL (ref 0–149)
VLDL Cholesterol Cal: 20 mg/dL (ref 5–40)

## 2019-09-28 LAB — CBC
Hematocrit: 41.5 % (ref 34.0–46.6)
Hemoglobin: 13.9 g/dL (ref 11.1–15.9)
MCH: 31 pg (ref 26.6–33.0)
MCHC: 33.5 g/dL (ref 31.5–35.7)
MCV: 92 fL (ref 79–97)
Platelets: 260 10*3/uL (ref 150–450)
RBC: 4.49 x10E6/uL (ref 3.77–5.28)
RDW: 11.8 % (ref 11.7–15.4)
WBC: 6.7 10*3/uL (ref 3.4–10.8)

## 2019-09-28 LAB — PROLACTIN: Prolactin: 8.4 ng/mL (ref 4.8–23.3)

## 2019-09-28 LAB — ESTRADIOL: Estradiol: <5 pg/mL

## 2019-09-28 LAB — TSH: TSH: 1.8 u[IU]/mL (ref 0.450–4.500)

## 2019-09-28 LAB — VITAMIN D 25 HYDROXY (VIT D DEFICIENCY, FRACTURES): Vit D, 25-Hydroxy: 20.3 ng/mL — ABNORMAL LOW (ref 30.0–100.0)

## 2019-09-29 LAB — IGP, APTIMA HPV: HPV Aptima: NEGATIVE

## 2019-09-29 NOTE — Progress Notes (Signed)
Please let the patient know that her pap smear was normal. Thanks.

## 2019-09-30 ENCOUNTER — Other Ambulatory Visit: Payer: Self-pay

## 2019-09-30 ENCOUNTER — Telehealth: Payer: Self-pay

## 2019-09-30 DIAGNOSIS — F411 Generalized anxiety disorder: Secondary | ICD-10-CM

## 2019-09-30 MED ORDER — CITALOPRAM HYDROBROMIDE 10 MG PO TABS: 20.0000 mg | ORAL_TABLET | Freq: Every day | ORAL | 1 refills | Status: DC

## 2019-09-30 NOTE — Telephone Encounter (Signed)
Pt was notified.  

## 2019-09-30 NOTE — Telephone Encounter (Signed)
-----   Message from Ronnell Freshwater, NP sent at 09/29/2019 11:38 AM EDT ----- Please let the patient know that her pap smear was normal. Thanks.

## 2019-10-11 DIAGNOSIS — Z23 Encounter for immunization: Secondary | ICD-10-CM | POA: Diagnosis not present

## 2019-10-21 ENCOUNTER — Encounter: Payer: Self-pay | Admitting: Dermatology

## 2019-11-25 DIAGNOSIS — M545 Low back pain: Secondary | ICD-10-CM | POA: Diagnosis not present

## 2019-12-01 DIAGNOSIS — M545 Low back pain: Secondary | ICD-10-CM | POA: Diagnosis not present

## 2019-12-08 DIAGNOSIS — M545 Low back pain: Secondary | ICD-10-CM | POA: Diagnosis not present

## 2019-12-14 DIAGNOSIS — M545 Low back pain: Secondary | ICD-10-CM | POA: Diagnosis not present

## 2019-12-16 DIAGNOSIS — M545 Low back pain: Secondary | ICD-10-CM | POA: Diagnosis not present

## 2020-01-04 DIAGNOSIS — M545 Low back pain: Secondary | ICD-10-CM | POA: Diagnosis not present

## 2020-01-13 DIAGNOSIS — H1032 Unspecified acute conjunctivitis, left eye: Secondary | ICD-10-CM | POA: Diagnosis not present

## 2020-01-25 ENCOUNTER — Telehealth: Payer: Self-pay

## 2020-01-25 NOTE — Telephone Encounter (Signed)
Confirmed and screened for 01-27-20 ov.

## 2020-01-27 ENCOUNTER — Ambulatory Visit: Payer: BC Managed Care – PPO | Admitting: Hospice and Palliative Medicine

## 2020-01-27 ENCOUNTER — Encounter: Payer: Self-pay | Admitting: Nurse Practitioner

## 2020-01-27 ENCOUNTER — Other Ambulatory Visit: Payer: Self-pay

## 2020-01-27 DIAGNOSIS — E538 Deficiency of other specified B group vitamins: Secondary | ICD-10-CM | POA: Diagnosis not present

## 2020-01-27 DIAGNOSIS — F411 Generalized anxiety disorder: Secondary | ICD-10-CM | POA: Diagnosis not present

## 2020-01-27 DIAGNOSIS — Z79899 Other long term (current) drug therapy: Secondary | ICD-10-CM | POA: Diagnosis not present

## 2020-01-27 DIAGNOSIS — F5101 Primary insomnia: Secondary | ICD-10-CM | POA: Diagnosis not present

## 2020-01-27 DIAGNOSIS — E559 Vitamin D deficiency, unspecified: Secondary | ICD-10-CM

## 2020-01-27 LAB — POCT URINE DRUG SCREEN
POC Amphetamine UR: NOT DETECTED
POC BENZODIAZEPINES UR: NOT DETECTED
POC Barbiturate UR: NOT DETECTED
POC Cocaine UR: NOT DETECTED
POC Ecstasy UR: NOT DETECTED
POC Marijuana UR: NOT DETECTED
POC Methadone UR: NOT DETECTED
POC Methamphetamine UR: NOT DETECTED
POC Opiate Ur: NOT DETECTED
POC Oxycodone UR: NOT DETECTED
POC PHENCYCLIDINE UR: NOT DETECTED
POC TRICYCLICS UR: NOT DETECTED

## 2020-01-27 MED ORDER — ERGOCALCIFEROL 1.25 MG (50000 UT) PO CAPS: ORAL_CAPSULE | ORAL | 1 refills | Status: DC

## 2020-01-27 MED ORDER — ZOLPIDEM TARTRATE 10 MG PO TABS: 10.0000 mg | ORAL_TABLET | Freq: Every evening | ORAL | 3 refills | Status: DC | PRN

## 2020-01-27 MED ORDER — ALPRAZOLAM 0.5 MG PO TABS: 0.5000 mg | ORAL_TABLET | Freq: Every day | ORAL | 3 refills | Status: AC | PRN

## 2020-01-27 NOTE — Progress Notes (Addendum)
[facility] [place], [place] [address]  Internal MEDICINE  Office Visit Note  Patient Name: Holly Le  [id number]  [id number]  Date of Service: 01/29/2020  Chief Complaint  Patient presents with  . Anxiety  . Depression    HPI Patient is here today for routine follow-up on her anxiety and depression. Requesting medication refills on her alprazolam and ambien. Takes her xanax on an as needed basis for anxiety. Feels as though she needs her ambien most nights to help her sleep. Will recheck B12 levels per her reqiest as she has been on supplements for some time and would like for it to be rechecked. Reviewed her LH and [place] levels with her as she questioned if she had reached menopause. Her LH and FSH levels do suggest menopause. She has been without a menstrual cycle for 10 months. Mentions she feels as though she is having sinus drainage at night as she describes a post-nasal drip and congestion in her nose and sinus areas.  Current Medication: Outpatient Encounter Medications as of 01/27/2020  Medication Sig  . ALPRAZolam (XANAX) 0.5 MG tablet Take 1 tablet (0.5 mg total) by mouth daily as needed.  . citalopram (CELEXA) 10 MG tablet Take 2 tablets (20 mg total) by mouth daily.  Marland Kitchen zolpidem (AMBIEN) 10 MG tablet Take 1 tablet (10 mg total) by mouth at bedtime as needed for sleep.  . [DISCONTINUED] ALPRAZolam (XANAX) 0.5 MG tablet Take 1 tablet (0.5 mg total) by mouth daily as needed.  . [DISCONTINUED] zolpidem (AMBIEN) 10 MG tablet Take 1 tablet (10 mg total) by mouth at bedtime as needed for sleep.  . ergocalciferol (DRISDOL) 1.25 MG (50000 UT) capsule Take one cap q week   No facility-administered encounter medications on file as of 01/27/2020.    Surgical History: Past Surgical History:  Procedure Laterality Date  . back injection    . CESAREAN SECTION     twins  . FOOT SURGERY    . LITHOTRIPSY    . SHOULDER SURGERY  01/2009   RIGHT    Medical  History: Past Medical History:  Diagnosis Date  . Anxiety   . Depression   . Grave's disease     Family History: Family History  Problem Relation Age of Onset  . Lymphoma Father   . Lymphoma Paternal Aunt   . Cancer Maternal Grandfather        COLON  . Breast cancer Paternal Aunt     Social History   Socioeconomic History  . Marital status: Married    Spouse name: Not on file  . Number of children: Not on file  . Years of education: Not on file  . Highest education level: Not on file  Occupational History  . Not on file  Tobacco Use  . Smoking status: Current Every Day Smoker    Types: E-cigarettes  . Smokeless tobacco: Never Used  . Tobacco comment: 5 a days   Vaping Use  . Vaping Use: Every day  . Devices: juul  Substance and Sexual Activity  . Alcohol use: Yes    Comment: occasionally  . Drug use: No  . Sexual activity: Not on file  Other Topics Concern  . Not on file  Social History Narrative  . Not on file   Social Determinants of Health   Financial Resource Strain:   . Difficulty of Paying Living Expenses:   Food Insecurity:   . Worried About Charity fundraiser in  the Last Year:   . [place] in the Last Year:   Transportation Needs:   . Film/video editor (Medical):   Marland Kitchen Lack of Transportation (Non-Medical):   Physical Activity:   . Days of Exercise per Week:   . Minutes of Exercise per Session:   Stress:   . Feeling of Stress :   Social Connections:   . Frequency of Communication with Friends and Family:   . Frequency of Social Gatherings with Friends and Family:   . Attends Religious Services:   . Active Member of Clubs or Organizations:   . Attends Archivist Meetings:   Marland Kitchen Marital Status:   Intimate Partner Violence:   . Fear of Current or Ex-Partner:   . Emotionally Abused:   Marland Kitchen Physically Abused:   . Sexually Abused:     Review of Systems  Constitutional: Negative.        For chills, fever, fatigue.  HENT:  Positive for postnasal drip and sinus pressure.        Negative for sinus pain, sinus pressure, sore throat, trouble swallowing.  Eyes: Negative.        For changes in vision or visual disturbances.  Respiratory: Negative.        For chest tightness, cough, shortness of breath, wheezing.  Cardiovascular: Negative.        For chest pain, ankle swelling, palpitations.  Gastrointestinal: Negative.        For abdominal pain, constipation, nausea, vomiting, diarrhea.  Endocrine: Negative.        For polydipsia, polyphagia, polyuria.  Genitourinary: Negative.        For dysuria, flank pain, hematuria, increased frequency, urgency.  Musculoskeletal: Negative.        For arthralgias, myalgias, back pain, neck pain, gait disturbances.  Skin: Negative.        For rash, wound.  Allergic/Immunologic: Negative.   Neurological: Negative.        For dizziness, headaches, tremors, weakness.  Hematological: Negative.   Psychiatric/Behavioral: Negative.        For confusion, depression, anxiety, sleep disturbances.    Vital Signs: BP 117/76   Pulse 68   Temp (!) 97.3 F (36.3 C)   Resp 16   Ht 5\' 4"  (1.626 m)   Wt 141 lb (64 kg)   SpO2 99%   BMI 24.20 kg/m    Physical Exam Constitutional:      Appearance: Normal appearance. She is normal weight.  HENT:     Nose: Nose normal.     Mouth/Throat:     Mouth: Mucous membranes are moist.     Pharynx: Oropharynx is clear.  Cardiovascular:     Rate and Rhythm: Normal rate and regular rhythm.     Pulses: Normal pulses.     Heart sounds: Normal heart sounds.  Pulmonary:     Effort: Pulmonary effort is normal.     Breath sounds: Normal breath sounds.  Abdominal:     General: Abdomen is flat. Bowel sounds are normal.     Palpations: Abdomen is soft.  Musculoskeletal:        General: Normal range of motion.     Cervical back: Normal range of motion.  Skin:    General: Skin is warm.  Neurological:     General: No focal deficit present.      Mental Status: She is alert and oriented to person, place, and time. Mental status is at baseline.  Psychiatric:  Mood and Affect: Mood normal.        Behavior: Behavior normal.        Thought Content: Thought content normal.    Assessment/Plan: 1. Encounter for long-term (current) use of high-risk medication - POCT Urine Drug Screen  2. Vitamin B12 deficiency Requests to have her B12 levels rechecked as she was on supplementation for some time. - B12  3. Primary insomnia Stable at this time on current therapy, continue to monitor. - zolpidem (AMBIEN) 10 MG tablet; Take 1 tablet (10 mg total) by mouth at bedtime as needed for sleep.  Dispense: 30 tablet; Refill: 3  4. Generalized anxiety disorder Stable at this time, will continue to monitor. - ALPRAZolam (XANAX) 0.5 MG tablet; Take 1 tablet (0.5 mg total) by mouth daily as needed.  Dispense: 20 tablet; Refill: 3  5. Vitamin D deficiency Vitamin D levels found to be low. Will start supplementation and repeat levels. - ergocalciferol (DRISDOL) 1.25 MG (50000 UT) capsule; Take one cap q week  Dispense: 12 capsule; Refill: 1  Reviewed risks and possible side effects associated with taking benzodiazepines and other CNS depressants. Combination of these could cause dizziness and drowsiness. Advised patient not to drive or operate machinery when taking these medications as patients and others lives can be at risk and will have consequences. Patient verbalized understanding in this matter. Dependence and abuse for these drugs will be monitored closely. A controlled substance policy and procedure is on file which allows [facility] to order a urine drug screen test at any visit. Patient understands and agrees with the plan.  General Counseling: Holly Le understanding of the findings of todays visit and agrees with plan of treatment. I have discussed any further diagnostic evaluation that may be needed or ordered  today. We also reviewed her medications today. she has been encouraged to call the office with any questions or concerns that should arise related to todays visit.    Orders Placed This Encounter  Procedures  . B12  . POCT Urine Drug Screen    Meds ordered this encounter  Medications  . zolpidem (AMBIEN) 10 MG tablet    Sig: Take 1 tablet (10 mg total) by mouth at bedtime as needed for sleep.    Dispense:  30 tablet    Refill:  3  . ALPRAZolam (XANAX) 0.5 MG tablet    Sig: Take 1 tablet (0.5 mg total) by mouth daily as needed.    Dispense:  20 tablet    Refill:  3  . ergocalciferol (DRISDOL) 1.25 MG (50000 UT) capsule    Sig: Take one cap q week    Dispense:  12 capsule    Refill:  1    Total time spent: 35 Minutes  This patient was seen today by Theodoro Grist, AGNP-C in collaboration with Dr. Lavera Guise as part of a collaborative care agreement.  Time spent includes review of chart, medications, test results, and follow up plan with the patient.    Tanna Furry Kenton Kingfisher, AGNP-C  Dr Lavera Guise Internal medicine

## 2020-02-02 DIAGNOSIS — M545 Low back pain: Secondary | ICD-10-CM | POA: Diagnosis not present

## 2020-02-07 DIAGNOSIS — M545 Low back pain: Secondary | ICD-10-CM | POA: Diagnosis not present

## 2020-02-10 DIAGNOSIS — M545 Low back pain: Secondary | ICD-10-CM | POA: Diagnosis not present

## 2020-02-14 DIAGNOSIS — M545 Low back pain: Secondary | ICD-10-CM | POA: Diagnosis not present

## 2020-02-18 DIAGNOSIS — M545 Low back pain: Secondary | ICD-10-CM | POA: Diagnosis not present

## 2020-02-23 DIAGNOSIS — M545 Low back pain: Secondary | ICD-10-CM | POA: Diagnosis not present

## 2020-03-07 ENCOUNTER — Ambulatory Visit: Payer: BC Managed Care – PPO | Admitting: Dermatology

## 2020-03-07 ENCOUNTER — Other Ambulatory Visit: Payer: Self-pay

## 2020-03-07 DIAGNOSIS — L409 Psoriasis, unspecified: Secondary | ICD-10-CM | POA: Diagnosis not present

## 2020-03-07 DIAGNOSIS — Z79899 Other long term (current) drug therapy: Secondary | ICD-10-CM

## 2020-03-07 NOTE — Progress Notes (Signed)
   Follow-Up Visit   Subjective  Holly Le is a 54 y.o. female who presents for the following: Follow-up (Patient here today for 6 month psoriasis follow up. She is taking [place] 30mg  twice daily with no side effects and good results. ).  She was off med for two weeks because of coverage issues, and her psoriasis flared.  Improved once she restarted it.  Areas of psoriasis are mostly scalp and post auricular but patient advises they are clear today. She is not having any problems getting the medication through the company's bridge program. Occasionally she does use Protopic for mild flares on face/body.   The following portions of the chart were reviewed this encounter and updated as appropriate:      Review of Systems:  No other skin or systemic complaints except as noted in HPI or Assessment and Plan.  Objective  Well appearing patient in no apparent distress; mood and affect are within normal limits.  A focused examination was performed including face, scalp, arms, legs. Relevant physical exam findings are noted in the Assessment and Plan.  Objective  Scalp: Mild scaling at scalp Scaly patch at left parietal scalp Arms, legs, face clear   Assessment & Plan  Psoriasis Scalp  Well-controlled Cont Otezla 30mg  twice daily, tolerating well, pt gets it through patient assistance program Cont Protopic daily as needed Cont clobetasol foam daily as needed. Avoid applying to face, groin, and axilla. Use as directed. Risk of skin atrophy with long-term use reviewed.  Recommend medicated shampoo as needed for scalp  Topical steroids (such as triamcinolone, fluocinolone, fluocinonide, mometasone, clobetasol, halobetasol, betamethasone, hydrocortisone) can cause thinning and lightening of the skin if they are used for too long in the same area. Your physician has selected the right strength medicine for your problem and area affected on the body. Please use your medication only as  directed by your physician to prevent side effects.   Side effects of Otezla (apremilast) include diarrhea, nausea, headache, upper respiratory infection, depression, and weight decrease (5-10%). It should only be taken by pregnant women after a discussion regarding risks and benefits with their doctor.  Long term medication management.  Return in about 6 months (around 09/04/2020) for TBSE, Psoriasis.  Graciella Belton, RMA, am acting as scribe for Brendolyn Patty, MD .  Documentation: I have reviewed the above documentation for accuracy and completeness, and I agree with the above.  Brendolyn Patty MD

## 2020-03-07 NOTE — Patient Instructions (Signed)
Side effects of Otezla (apremilast) include diarrhea, nausea, headache, upper respiratory infection, depression, and weight decrease (5-10%). It should only be taken by pregnant women after a discussion regarding risks and benefits with their doctor.  Topical steroids (such as triamcinolone, fluocinolone, fluocinonide, mometasone, clobetasol, halobetasol, betamethasone, hydrocortisone) can cause thinning and lightening of the skin if they are used for too long in the same area. Your physician has selected the right strength medicine for your problem and area affected on the body. Please use your medication only as directed by your physician to prevent side effects.

## 2020-03-22 ENCOUNTER — Other Ambulatory Visit: Payer: Self-pay

## 2020-03-22 DIAGNOSIS — R0989 Other specified symptoms and signs involving the circulatory and respiratory systems: Secondary | ICD-10-CM | POA: Diagnosis not present

## 2020-03-22 DIAGNOSIS — J40 Bronchitis, not specified as acute or chronic: Secondary | ICD-10-CM | POA: Diagnosis not present

## 2020-03-22 DIAGNOSIS — Z03818 Encounter for observation for suspected exposure to other biological agents ruled out: Secondary | ICD-10-CM | POA: Diagnosis not present

## 2020-03-22 DIAGNOSIS — L409 Psoriasis, unspecified: Secondary | ICD-10-CM

## 2020-03-22 MED ORDER — OTEZLA 30 MG PO TABS: ORAL_TABLET | ORAL | 6 refills | Status: DC

## 2020-03-22 NOTE — Progress Notes (Signed)
Refill request from pharmacy /

## 2020-04-03 ENCOUNTER — Other Ambulatory Visit: Payer: Self-pay

## 2020-04-03 DIAGNOSIS — F411 Generalized anxiety disorder: Secondary | ICD-10-CM

## 2020-04-03 MED ORDER — CITALOPRAM HYDROBROMIDE 10 MG PO TABS: 20.0000 mg | ORAL_TABLET | Freq: Every day | ORAL | 1 refills | Status: DC

## 2020-05-11 DIAGNOSIS — K921 Melena: Secondary | ICD-10-CM | POA: Diagnosis not present

## 2020-05-11 DIAGNOSIS — R110 Nausea: Secondary | ICD-10-CM | POA: Diagnosis not present

## 2020-05-11 DIAGNOSIS — R197 Diarrhea, unspecified: Secondary | ICD-10-CM | POA: Diagnosis not present

## 2020-05-29 ENCOUNTER — Other Ambulatory Visit: Payer: Self-pay | Admitting: Nurse Practitioner

## 2020-05-29 ENCOUNTER — Ambulatory Visit
Admission: RE | Admit: 2020-05-29 | Discharge: 2020-05-29 | Disposition: A | Discharge status: Home / Self Care | Payer: BC Managed Care – PPO | Attending: Nurse Practitioner | Admitting: Nurse Practitioner

## 2020-05-29 ENCOUNTER — Ambulatory Visit
Admission: RE | Admit: 2020-05-29 | Discharge: 2020-05-29 | Disposition: A | Discharge status: Home / Self Care | Payer: BC Managed Care – PPO | Source: Ambulatory Visit | Attending: Nurse Practitioner | Admitting: Nurse Practitioner

## 2020-05-29 ENCOUNTER — Ambulatory Visit (INDEPENDENT_AMBULATORY_CARE_PROVIDER_SITE_OTHER): Payer: BC Managed Care – PPO | Admitting: Nurse Practitioner

## 2020-05-29 ENCOUNTER — Encounter: Payer: Self-pay | Admitting: Nurse Practitioner

## 2020-05-29 ENCOUNTER — Other Ambulatory Visit: Payer: Self-pay

## 2020-05-29 VITALS — BP 117/63 | HR 69 | Temp 97.8°F | Resp 16 | Ht 64.0 in | Wt 140.8 lb

## 2020-05-29 DIAGNOSIS — F5101 Primary insomnia: Secondary | ICD-10-CM

## 2020-05-29 DIAGNOSIS — R1110 Vomiting, unspecified: Secondary | ICD-10-CM | POA: Insufficient documentation

## 2020-05-29 DIAGNOSIS — R079 Chest pain, unspecified: Secondary | ICD-10-CM | POA: Insufficient documentation

## 2020-05-29 DIAGNOSIS — Z87891 Personal history of nicotine dependence: Secondary | ICD-10-CM | POA: Diagnosis not present

## 2020-05-29 DIAGNOSIS — R5383 Other fatigue: Secondary | ICD-10-CM

## 2020-05-29 DIAGNOSIS — A084 Viral intestinal infection, unspecified: Secondary | ICD-10-CM | POA: Diagnosis not present

## 2020-05-29 DIAGNOSIS — R9431 Abnormal electrocardiogram [ECG] [EKG]: Secondary | ICD-10-CM | POA: Insufficient documentation

## 2020-05-29 DIAGNOSIS — R10817 Generalized abdominal tenderness: Secondary | ICD-10-CM

## 2020-05-29 DIAGNOSIS — D509 Iron deficiency anemia, unspecified: Secondary | ICD-10-CM | POA: Diagnosis not present

## 2020-05-29 DIAGNOSIS — R112 Nausea with vomiting, unspecified: Secondary | ICD-10-CM

## 2020-05-29 DIAGNOSIS — R0789 Other chest pain: Secondary | ICD-10-CM | POA: Diagnosis not present

## 2020-05-29 MED ORDER — ZOLPIDEM TARTRATE 10 MG PO TABS: 10.0000 mg | ORAL_TABLET | Freq: Every evening | ORAL | 3 refills | Status: DC | PRN

## 2020-05-29 MED ORDER — NICOTINE 21 MG/24HR TD PT24: 21.0000 mg | MEDICATED_PATCH | Freq: Every day | TRANSDERMAL | 1 refills | Status: DC

## 2020-05-29 NOTE — Progress Notes (Signed)
[facility] [place], [place] [address]  Internal MEDICINE  Office Visit Note  Patient Name: Holly Le  [id number]  [id number]  Date of Service: 05/29/2020  Chief Complaint  Patient presents with  . Annual Exam    when laying down has arm pain,  . Depression  . Quality Metric Gaps    hep C  . controlled substance form    reviewed with PT  . Abdominal Pain    at night when laying down    The patient presents for follow up visit. She has been having pain behind the left breast bone and radiating to the left arm a few weeks ago. This is happening mostly at night. She does have a great deal of stress. She states that two years ago, she started using vaping cigarettes. She states that she never smoked a day in her life, then tried vaping and it made her feel less stressed. She uses approximately on Juul pod each day which is about one pack of cigarettes a day. She is afraid that this may be causing some of this chest discomfort. An ECG was performed today. There is low voltage with bradycardia. This is consistent with possible pulmonary disease.  She denies wheezing and shortness of breath. She would like to stop vaping and has asked to try nicotine patch.   She states that she had terrible issue with GI symptoms a few weeks ago. Had violent vomiting and diarrhea to the point that she was passing a great deal of bright red blood, including clots. She was seen at urgent care in [facility]. Since then, she has had a great deal of tenderness and discomfort in the left upper quadrant of the abdomen, just underneath the rib cage. At walk in clinic, she was give a prescription for zofran to help with the nausea, but was told this should subside. If persistent, she should be seen by GI provider. Today, the patient states that diarrhea and nausea and vomiting have subsided. She is no longer having blood in the stool.  She does take [place] due to chronic psoriasis.         Current Medication: Outpatient Encounter Medications as of 05/29/2020  Medication Sig  . ALPRAZolam (XANAX) 0.5 MG tablet Take 1 tablet (0.5 mg total) by mouth daily as needed.  . citalopram (CELEXA) 10 MG tablet Take 2 tablets (20 mg total) by mouth daily.  . ergocalciferol (DRISDOL) 1.25 MG (50000 UT) capsule Take one cap q week  . OTEZLA 30 MG TABS Take 1 tab twice a day every day as directed.  . zolpidem (AMBIEN) 10 MG tablet Take 1 tablet (10 mg total) by mouth at bedtime as needed for sleep.  . [DISCONTINUED] zolpidem (AMBIEN) 10 MG tablet Take 1 tablet (10 mg total) by mouth at bedtime as needed for sleep.  . nicotine (NICODERM CQ - DOSED IN MG/24 HOURS) 21 mg/24hr patch Place 1 patch (21 mg total) onto the skin daily.   No facility-administered encounter medications on file as of 05/29/2020.    Surgical History: Past Surgical History:  Procedure Laterality Date  . back injection    . CESAREAN SECTION     twins  . FOOT SURGERY    . LITHOTRIPSY    . SHOULDER SURGERY  01/2009   RIGHT    Medical History: Past Medical History:  Diagnosis Date  . Anxiety   . Depression   . Dysplastic nevus 10/14/2008   Lower back.  Moderate atypia, extends to one edge.  Marland Kitchen Dysplastic nevus 10/07/2013   Right lower back. Moderate atypia, limited margins free.  [name]     Family History: Family History  Problem Relation Age of Onset  . Lymphoma Father   . Lymphoma Paternal Aunt   . Cancer Maternal Grandfather        COLON  . Breast cancer Paternal Aunt     Social History   Socioeconomic History  . Marital status: Married    Spouse name: Not on file  . Number of children: Not on file  . Years of education: Not on file  . Highest education level: Not on file  Occupational History  . Not on file  Tobacco Use  . Smoking status: Current Every Day Smoker    Types: E-cigarettes  . Smokeless tobacco: Never Used  . Tobacco comment: 5 a days   Vaping Use  .  Vaping Use: Every day  . Devices: juul  Substance and Sexual Activity  . Alcohol use: Yes    Comment: occasionally  . Drug use: No  . Sexual activity: Not on file  Other Topics Concern  . Not on file  Social History Narrative  . Not on file   Social Determinants of Health   Financial Resource Strain:   . Difficulty of Paying Living Expenses: Not on file  Food Insecurity:   . Worried About Charity fundraiser in the Last Year: Not on file  . Ran Out of Food in the Last Year: Not on file  Transportation Needs:   . Lack of Transportation (Medical): Not on file  . Lack of Transportation (Non-Medical): Not on file  Physical Activity:   . Days of Exercise per Week: Not on file  . Minutes of Exercise per Session: Not on file  Stress:   . Feeling of Stress : Not on file  Social Connections:   . Frequency of Communication with Friends and Family: Not on file  . Frequency of Social Gatherings with Friends and Family: Not on file  . Attends Religious Services: Not on file  . Active Member of Clubs or Organizations: Not on file  . Attends Archivist Meetings: Not on file  . Marital Status: Not on file  Intimate Partner Violence:   . Fear of Current or Ex-Partner: Not on file  . Emotionally Abused: Not on file  . Physically Abused: Not on file  . Sexually Abused: Not on file      Review of Systems  Constitutional: Positive for appetite change and fatigue. Negative for chills and unexpected weight change.  HENT: Negative for congestion, postnasal drip, rhinorrhea, sneezing and sore throat.   Respiratory: Positive for shortness of breath. Negative for cough.   Cardiovascular: Positive for chest pain. Negative for palpitations.  Gastrointestinal: Positive for blood in stool, nausea, rectal pain and vomiting. Negative for abdominal pain, constipation and diarrhea.  Endocrine: Negative for cold intolerance, heat intolerance, polydipsia and polyuria.  Musculoskeletal:  Negative for arthralgias, back pain, joint swelling and neck pain.  Skin: Negative for rash.  Neurological: Negative for dizziness, tremors, numbness and headaches.  Hematological: Negative for adenopathy. Does not bruise/bleed easily.  Psychiatric/Behavioral: Positive for sleep disturbance. Negative for behavioral problems (Depression) and suicidal ideas. The patient is nervous/anxious.     Today's Vitals   05/29/20 0828  BP: 117/63  Pulse: 69  Resp: 16  Temp: 97.8 F (36.6 C)  SpO2: 99%  Weight: 140 lb 12.8 oz (  63.9 kg)  Height: 5\' 4"  (1.626 m)   Body mass index is 24.17 kg/m.  Physical Exam Vitals and nursing note reviewed.  Constitutional:      General: She is not in acute distress.    Appearance: Normal appearance. She is well-developed. She is not diaphoretic.  HENT:     Head: Normocephalic and atraumatic.     Mouth/Throat:     Pharynx: Oropharynx is clear. No oropharyngeal exudate.  Eyes:     Pupils: Pupils are equal, round, and reactive to light.  Neck:     Thyroid: No thyromegaly.     Vascular: No JVD.     Trachea: No tracheal deviation.  Cardiovascular:     Rate and Rhythm: Normal rate and regular rhythm.     Heart sounds: Normal heart sounds. No murmur heard.  No friction rub. No gallop.      Comments: ECG showing bradycardia and low voltage.  Pulmonary:     Effort: Pulmonary effort is normal. No respiratory distress.     Breath sounds: Normal breath sounds. No wheezing or rales.  Chest:     Chest wall: No tenderness.  Abdominal:     General: Bowel sounds are normal.     Palpations: Abdomen is soft.     Tenderness: There is abdominal tenderness.     Comments: Mild, generalized abdominal tenderness   Musculoskeletal:        General: Normal range of motion.     Cervical back: Normal range of motion and neck supple.  Lymphadenopathy:     Cervical: No cervical adenopathy.  Skin:    General: Skin is warm and dry.  Neurological:     Mental Status: She  is alert and oriented to person, place, and time.     Cranial Nerves: No cranial nerve deficit.  Psychiatric:        Mood and Affect: Mood normal.        Behavior: Behavior normal.        Thought Content: Thought content normal.        Judgment: Judgment normal.    Assessment/Plan: 1. Chest pain, unspecified type ECG showing bradycardia and low voltage. Consistent with possible pulmonary disease. Will get chest x-ray and PFT for further evaluation.  - EKG 12-Lead - DG Chest 2 View; Future - Pulmonary Function Test; Future  2. Abnormal ECG Echo ordered for further evaluation.  - ECHOCARDIOGRAM COMPLETE; Future  3. Gastroenteritis and colitis, viral CT abdomen with contrast - CT ABDOMEN W CONTRAST; Future  4. Generalized abdominal tenderness without rebound tenderness CT abdomen with contrast  5. Intractable vomiting with nausea, unspecified vomiting type CT abdomen with contrast - CT ABDOMEN W CONTRAST; Future  6. History of nicotine vaping Start nicotine patch 21mg /24hours daily. Instructions for use reviewed with patient. - DG Chest 2 View; Future - Pulmonary Function Test; Future - nicotine (NICODERM CQ - DOSED IN MG/24 HOURS) 21 mg/24hr patch; Place 1 patch (21 mg total) onto the skin daily.  Dispense: 28 patch; Refill: 1  7. Primary insomnia May continue to take ambien as needed and as prescribed  - zolpidem (AMBIEN) 10 MG tablet; Take 1 tablet (10 mg total) by mouth at bedtime as needed for sleep.  Dispense: 30 tablet; Refill: 3  8. Other fatigue Lab work ordered, including thyroid and anemia panels.   General Counseling: Holly Le understanding of the findings of todays visit and agrees with plan of treatment. I have discussed any further diagnostic evaluation that may  be needed or ordered today. We also reviewed her medications today. she has been encouraged to call the office with any questions or concerns that should arise related to todays  visit.  Smoking cessation counseling: 1. Pt acknowledges the risks of long term smoking, she will try to quite smoking. 2. Options for different medications including nicotine products, chewing gum, patch etc, Wellbutrin and Chantix is discussed 3. Goal and date of compete cessation is discussed 4. Total time spent in smoking cessation is 15 min.  This patient was seen by Leretha Pol FNP Collaboration with Dr Lavera Guise as a part of collaborative care agreement   Orders Placed This Encounter  Procedures  . DG Chest 2 View  . CT ABDOMEN W CONTRAST  . EKG 12-Lead  . ECHOCARDIOGRAM COMPLETE  . Pulmonary Function Test    Meds ordered this encounter  Medications  . zolpidem (AMBIEN) 10 MG tablet    Sig: Take 1 tablet (10 mg total) by mouth at bedtime as needed for sleep.    Dispense:  30 tablet    Refill:  3    Order Specific Question:   Supervising Provider    Answer:   Lavera Guise [[id number]]  . nicotine (NICODERM CQ - DOSED IN MG/24 HOURS) 21 mg/24hr patch    Sig: Place 1 patch (21 mg total) onto the skin daily.    Dispense:  28 patch    Refill:  1    Order Specific Question:   Supervising Provider    Answer:   Lavera Guise [[id number]]    Total time spent: 44 Minutes   Time spent includes review of chart, medications, test results, and follow up plan with the patient.      Dr Lavera Guise Internal medicine

## 2020-05-29 NOTE — Progress Notes (Signed)
Normal chest x-ray.

## 2020-05-30 LAB — LIPASE: Lipase: 35 U/L (ref 14–72)

## 2020-05-30 LAB — CBC
Hematocrit: 42.2 % (ref 34.0–46.6)
Hemoglobin: 14.3 g/dL (ref 11.1–15.9)
MCH: 30.7 pg (ref 26.6–33.0)
MCHC: 33.9 g/dL (ref 31.5–35.7)
MCV: 91 fL (ref 79–97)
Platelets: 271 10*3/uL (ref 150–450)
RBC: 4.66 x10E6/uL (ref 3.77–5.28)
RDW: 12 % (ref 11.7–15.4)
WBC: 7 10*3/uL (ref 3.4–10.8)

## 2020-05-30 LAB — COMPREHENSIVE METABOLIC PANEL
ALT: 9 IU/L (ref 0–32)
AST: 21 IU/L (ref 0–40)
Albumin/Globulin Ratio: 1.5 (ref 1.2–2.2)
Albumin: 4.7 g/dL (ref 3.8–4.9)
Alkaline Phosphatase: 81 IU/L (ref 44–121)
BUN/Creatinine Ratio: 14 (ref 9–23)
BUN: 13 mg/dL (ref 6–24)
Bilirubin Total: 0.4 mg/dL (ref 0.0–1.2)
CO2: 19 mmol/L — ABNORMAL LOW (ref 20–29)
Calcium: 9.7 mg/dL (ref 8.7–10.2)
Chloride: 104 mmol/L (ref 96–106)
Creatinine, Ser: 0.9 mg/dL (ref 0.57–1.00)
GFR calc Af Amer: 85 mL/min/{1.73_m2} (ref >59)
GFR calc non Af Amer: 74 mL/min/{1.73_m2} (ref >59)
Globulin, Total: 3.1 g/dL (ref 1.5–4.5)
Glucose: 101 mg/dL — ABNORMAL HIGH (ref 65–99)
Potassium: 4.4 mmol/L (ref 3.5–5.2)
Sodium: 141 mmol/L (ref 134–144)
Total Protein: 7.8 g/dL (ref 6.0–8.5)

## 2020-05-30 LAB — IRON AND TIBC
Iron Saturation: 31 % (ref 15–55)
Iron: 131 ug/dL (ref 27–159)
Total Iron Binding Capacity: 428 ug/dL (ref 250–450)
UIBC: 297 ug/dL (ref 131–425)

## 2020-05-30 LAB — T4, FREE: Free T4: 1.14 ng/dL (ref 0.82–1.77)

## 2020-05-30 LAB — AMYLASE: Amylase: 38 U/L (ref 31–110)

## 2020-05-30 LAB — TSH: TSH: 1.76 u[IU]/mL (ref 0.450–4.500)

## 2020-05-30 LAB — FERRITIN: Ferritin: 33 ng/mL (ref 15–150)

## 2020-05-30 LAB — B12 AND FOLATE PANEL
Folate: 19.2 ng/mL (ref >3.0)
Vitamin B-12: 187 pg/mL — ABNORMAL LOW (ref 232–1245)

## 2020-05-30 LAB — VITAMIN D 25 HYDROXY (VIT D DEFICIENCY, FRACTURES): Vit D, 25-Hydroxy: 25.8 ng/mL — ABNORMAL LOW (ref 30.0–100.0)

## 2020-05-31 ENCOUNTER — Telehealth: Payer: Self-pay

## 2020-05-31 NOTE — Telephone Encounter (Signed)
Left message advising pt of ct appt. Holly Le

## 2020-06-07 ENCOUNTER — Other Ambulatory Visit: Payer: Self-pay

## 2020-06-07 ENCOUNTER — Ambulatory Visit: Payer: BC Managed Care – PPO

## 2020-06-07 DIAGNOSIS — R9431 Abnormal electrocardiogram [ECG] [EKG]: Secondary | ICD-10-CM

## 2020-06-07 NOTE — Progress Notes (Signed)
Normal LVEG with trace valvular regurgitation.

## 2020-06-07 NOTE — Progress Notes (Signed)
I can't send messages to scheduling. Can you let the patient know that her B12 levels were low on her labs. We should have her set up for weekly b12 injections, then monthly after that. Will need to go through scheduling. Thanks.

## 2020-06-13 ENCOUNTER — Other Ambulatory Visit: Payer: Self-pay

## 2020-06-13 ENCOUNTER — Ambulatory Visit
Admission: RE | Admit: 2020-06-13 | Discharge: 2020-06-13 | Disposition: A | Discharge status: Home / Self Care | Payer: BC Managed Care – PPO | Source: Ambulatory Visit | Attending: Nurse Practitioner | Admitting: Nurse Practitioner

## 2020-06-13 DIAGNOSIS — A084 Viral intestinal infection, unspecified: Secondary | ICD-10-CM | POA: Diagnosis not present

## 2020-06-13 DIAGNOSIS — K2970 Gastritis, unspecified, without bleeding: Secondary | ICD-10-CM | POA: Diagnosis not present

## 2020-06-13 DIAGNOSIS — K429 Umbilical hernia without obstruction or gangrene: Secondary | ICD-10-CM | POA: Diagnosis not present

## 2020-06-13 DIAGNOSIS — K6389 Other specified diseases of intestine: Secondary | ICD-10-CM | POA: Diagnosis not present

## 2020-06-13 DIAGNOSIS — R112 Nausea with vomiting, unspecified: Secondary | ICD-10-CM | POA: Insufficient documentation

## 2020-06-13 MED ORDER — IOHEXOL 300 MG/ML  SOLN
100.0000 mL | Freq: Once | INTRAMUSCULAR | Status: AC | PRN
  Administered 2020-06-13: 100 mL via INTRAVENOUS

## 2020-06-14 NOTE — Progress Notes (Signed)
Review with patient next visit. 12/28

## 2020-06-20 ENCOUNTER — Ambulatory Visit: Payer: BC Managed Care – PPO | Admitting: Hospice and Palliative Medicine

## 2020-06-21 ENCOUNTER — Other Ambulatory Visit: Payer: Self-pay | Admitting: Nurse Practitioner

## 2020-06-21 DIAGNOSIS — E538 Deficiency of other specified B group vitamins: Secondary | ICD-10-CM

## 2020-06-21 MED ORDER — "INSULIN SYRINGE/NEEDLE 28G X 1/2"" 1 ML MISC": 3 refills | Status: DC

## 2020-06-21 MED ORDER — CYANOCOBALAMIN 1000 MCG/ML IJ SOLN: INTRAMUSCULAR | 3 refills | Status: DC

## 2020-06-21 NOTE — Progress Notes (Signed)
Please let the patient know that I sent prescription for b12 vials and syringes for b12 injections. She should inject 66ml once weekly for 4 weeks then once monthly after that. [facility]. Thanks.

## 2020-07-03 ENCOUNTER — Telehealth: Payer: Self-pay

## 2020-07-03 NOTE — Telephone Encounter (Signed)
Confirmed 07-05-20 pft, patient advised of copay. Loma Sousa

## 2020-07-05 ENCOUNTER — Ambulatory Visit: Payer: BC Managed Care – PPO | Admitting: Internal Medicine

## 2020-07-06 ENCOUNTER — Ambulatory Visit: Payer: BC Managed Care – PPO | Admitting: Hospice and Palliative Medicine

## 2020-07-07 ENCOUNTER — Telehealth: Payer: Self-pay

## 2020-07-07 NOTE — Telephone Encounter (Signed)
Lmom to reschedule missed ov from  07-05-20 and 07-06-20. Holly Le

## 2020-08-22 ENCOUNTER — Encounter: Payer: BC Managed Care – PPO | Admitting: Dermatology

## 2020-08-23 ENCOUNTER — Ambulatory Visit: Payer: BC Managed Care – PPO | Admitting: Dermatology

## 2020-08-23 ENCOUNTER — Other Ambulatory Visit: Payer: Self-pay

## 2020-08-23 DIAGNOSIS — L821 Other seborrheic keratosis: Secondary | ICD-10-CM

## 2020-08-23 DIAGNOSIS — L814 Other melanin hyperpigmentation: Secondary | ICD-10-CM

## 2020-08-23 DIAGNOSIS — L409 Psoriasis, unspecified: Secondary | ICD-10-CM | POA: Diagnosis not present

## 2020-08-23 DIAGNOSIS — L708 Other acne: Secondary | ICD-10-CM

## 2020-08-23 DIAGNOSIS — D239 Other benign neoplasm of skin, unspecified: Secondary | ICD-10-CM

## 2020-08-23 DIAGNOSIS — D2371 Other benign neoplasm of skin of right lower limb, including hip: Secondary | ICD-10-CM

## 2020-08-23 DIAGNOSIS — Z1283 Encounter for screening for malignant neoplasm of skin: Secondary | ICD-10-CM

## 2020-08-23 DIAGNOSIS — D229 Melanocytic nevi, unspecified: Secondary | ICD-10-CM

## 2020-08-23 DIAGNOSIS — Z86018 Personal history of other benign neoplasm: Secondary | ICD-10-CM

## 2020-08-23 DIAGNOSIS — D1800 Hemangioma unspecified site: Secondary | ICD-10-CM

## 2020-08-23 DIAGNOSIS — R238 Other skin changes: Secondary | ICD-10-CM

## 2020-08-23 DIAGNOSIS — D485 Neoplasm of uncertain behavior of skin: Secondary | ICD-10-CM

## 2020-08-23 DIAGNOSIS — L578 Other skin changes due to chronic exposure to nonionizing radiation: Secondary | ICD-10-CM

## 2020-08-23 DIAGNOSIS — D225 Melanocytic nevi of trunk: Secondary | ICD-10-CM

## 2020-08-23 MED ORDER — DOXYCYCLINE MONOHYDRATE 100 MG PO CAPS: 100.0000 mg | ORAL_CAPSULE | Freq: Two times a day (BID) | ORAL | 0 refills | Status: AC

## 2020-08-23 MED ORDER — MUPIROCIN 2 % EX OINT: 1.0000 "application " | TOPICAL_OINTMENT | Freq: Three times a day (TID) | CUTANEOUS | 0 refills | Status: DC

## 2020-08-23 NOTE — Progress Notes (Signed)
Follow-Up Visit   Subjective  Holly Le is a 55 y.o. female who presents for the following: TBSE (Patient here for full body skin exam and skin cancer screening. Pt with hx dysplastic nevi. She has noticed a spot at left ear that does hurt, present for 2-3 weeks. ) and Psoriasis (Patient also following up for scalp psoriasis. She it taking Otezla 30mg  BID and uses clobetasol foam and protopic as needed. She still flares occasionally with stress. ).   The following portions of the chart were reviewed this encounter and updated as appropriate:   Tobacco  Allergies  Meds  Problems  Med Hx  Surg Hx  Fam Hx      Review of Systems:  No other skin or systemic complaints except as noted in HPI or Assessment and Plan.  Objective  Well appearing patient in no apparent distress; mood and affect are within normal limits.  A full examination was performed including scalp, head, eyes, ears, nose, lips, neck, chest, axillae, abdomen, back, buttocks, bilateral upper extremities, bilateral lower extremities, hands, feet, fingers, toes, fingernails, and toenails. All findings within normal limits unless otherwise noted below.  Objective  Scalp: Minimal scale and erythema  Objective  Right Thigh x 2: Firm pink/brown papule nodule with dimple sign.   Objective  Right Lower Back superior: 0.4cm thin brown papule, darker focus centrally   Objective  Right Lower Back inferior: 0.4cm brown macule within scar   Objective  Left anti helix: Inflammatory papule   Assessment & Plan  Psoriasis Scalp  Patient does have joint pain at fingers in the evening, knees when walking down steps.  Some stiffness in the morning but subsides in approximately 15 minutes after moving around a little bit.   Continue Otezla 30mg  BID Continue clobetasol foam to scalp QD PRN flares. Avoid applying to face, groin, and axilla. Use as directed. Risk of skin atrophy with long-term use reviewed.   Continue Protopic QD PRN flares.   Topical steroids (such as triamcinolone, fluocinolone, fluocinonide, mometasone, clobetasol, halobetasol, betamethasone, hydrocortisone) can cause thinning and lightening of the skin if they are used for too long in the same area. Your physician has selected the right strength medicine for your problem and area affected on the body. Please use your medication only as directed by your physician to prevent side effects.    Other Related Medications OTEZLA 30 MG TABS  Dermatofibroma Right Thigh x 2  Benign-appearing.  Observation.  Call clinic for new or changing lesions.    Recommend using the protopic or clobetasol foam if becomes itchy. If symptomatic despite this, could treat with LN2 or ILK at follow-up.  Neoplasm of uncertain behavior of skin (2) Right Lower Back superior  Epidermal / dermal shaving  Lesion diameter (cm):  0.4 Informed consent: discussed and consent obtained   Timeout: patient name, date of birth, surgical site, and procedure verified   Patient was prepped and draped in usual sterile fashion: area prepped with isopropyl alcohol. Anesthesia: the lesion was anesthetized in a standard fashion   Anesthetic:  1% lidocaine w/ epinephrine 1-100,000 buffered w/ 8.4% NaHCO3 Instrument used: DermaBlade   Hemostasis achieved with: aluminum chloride   Outcome: patient tolerated procedure well   Post-procedure details: wound care instructions given   Additional details:  Mupirocin and a bandage applied  Specimen 1 - Surgical pathology Differential Diagnosis: R/o Atypia  Check Margins: No 0.4cm thin brown papule, darker focus centrally   Right Lower Back inferior  Epidermal /  dermal shaving  Lesion diameter (cm):  0.4 Informed consent: discussed and consent obtained   Timeout: patient name, date of birth, surgical site, and procedure verified   Patient was prepped and draped in usual sterile fashion: area prepped with isopropyl  alcohol. Anesthesia: the lesion was anesthetized in a standard fashion   Anesthetic:  1% lidocaine w/ epinephrine 1-100,000 buffered w/ 8.4% NaHCO3 Instrument used: DermaBlade   Hemostasis achieved with: aluminum chloride   Outcome: patient tolerated procedure well   Post-procedure details: wound care instructions given   Additional details:  Mupirocin and a bandage applied  Specimen 2 - Surgical pathology Differential Diagnosis: r/o recurrent nevus vs recurrent atypical nevus  Check Margins: No 0.4cm brown macule within scar [id number]    Inflammatory papule Left anti helix  Persistent and tender Start doxycycline monohydrate 100mg  BID x 7 days with food.   Start mupirocin to AA TID  Ordered Medications: mupirocin ointment (BACTROBAN) 2 %  Lentigines - Scattered tan macules - Due to sun exposure - Benign-appering, observe - Recommend daily broad spectrum sunscreen SPF 30+ to sun-exposed areas, reapply every 2 hours as needed. - Call for any changes  Seborrheic Keratoses - Stuck-on, waxy, tan-brown papules and plaques  - Discussed benign etiology and prognosis. - Observe - Call for any changes  Melanocytic Nevi - Tan-brown and/or pink-flesh-colored symmetric macules and papules - Benign appearing on exam today - Observation - Call clinic for new or changing moles - Recommend daily use of broad spectrum spf 30+ sunscreen to sun-exposed areas.   Hemangiomas - Red papules - Discussed benign nature - Observe - Call for any changes  Actinic Damage - Chronic, secondary to cumulative UV/sun exposure - diffuse scaly erythematous macules with underlying dyspigmentation - Recommend daily broad spectrum sunscreen SPF 30+ to sun-exposed areas, reapply every 2 hours as needed.  - Call for new or changing lesions.  Skin cancer screening performed today.  History of Dysplastic Nevi - No evidence of recurrence today - Recommend regular full body skin exams -  Recommend daily broad spectrum sunscreen SPF 30+ to sun-exposed areas, reapply every 2 hours as needed.  - Call if any new or changing lesions are noted between office visits  Return in about 1 year (around 08/23/2021) for TBSE, Psoriasis.  Graciella Belton, RMA, am acting as scribe for Forest Gleason, MD .  Documentation: I have reviewed the above documentation for accuracy and completeness, and I agree with the above.  Forest Gleason, MD

## 2020-08-23 NOTE — Patient Instructions (Addendum)
Melanoma ABCDEs  Melanoma is the most dangerous type of skin cancer, and is the leading cause of death from skin disease.  You are more likely to develop melanoma if you:  Have light-colored skin, light-colored eyes, or red or blond hair  Spend a lot of time in the sun  Tan regularly, either outdoors or in a tanning bed  Have had blistering sunburns, especially during childhood  Have a close family member who has had a melanoma  Have atypical moles or large birthmarks  Early detection of melanoma is key since treatment is typically straightforward and cure rates are extremely high if we catch it early.   The first sign of melanoma is often a change in a mole or a new dark spot.  The ABCDE system is a way of remembering the signs of melanoma.  A for asymmetry:  The two halves do not match. B for border:  The edges of the growth are irregular. C for color:  A mixture of colors are present instead of an even brown color. D for diameter:  Melanomas are usually (but not always) greater than 41mm - the size of a pencil eraser. E for evolution:  The spot keeps changing in size, shape, and color.  Please check your skin once per month between visits. You can use a small mirror in front and a large mirror behind you to keep an eye on the back side or your body.   If you see any new or changing lesions before your next follow-up, please call to schedule a visit.  Please continue daily skin protection including broad spectrum sunscreen SPF 30+ to sun-exposed areas, reapplying every 2 hours as needed when you're outdoors.    Topical steroids (such as triamcinolone, fluocinolone, fluocinonide, mometasone, clobetasol, halobetasol, betamethasone, hydrocortisone) can cause thinning and lightening of the skin if they are used for too long in the same area. Your physician has selected the right strength medicine for your problem and area affected on the body. Please use your medication only as directed  by your physician to prevent side effects.    Recommend taking Heliocare sun protection supplement daily in sunny weather for additional sun protection. For maximum protection on the sunniest days, you can take up to 2 capsules of regular Heliocare OR take 1 capsule of Heliocare Ultra. For prolonged exposure (such as a full day in the sun), you can repeat your dose of the supplement 4 hours after your first dose. Heliocare can be purchased at [facility] or at [email or web address].    Doxycycline should be taken with food to prevent nausea. Do not lay down for 30 minutes after taking. Be cautious with sun exposure and use good sun protection while on this medication. Pregnant women should not take this medication.    Wound Care Instructions  1. Cleanse wound gently with soap and water once a day then pat dry with clean gauze. Apply a thing coat of Petrolatum (petroleum jelly, "Vaseline") over the wound (unless you have an allergy to this). We recommend that you use a new, sterile tube of Vaseline. Do not pick or remove scabs. Do not remove the yellow or white "healing tissue" from the base of the wound.  2. Cover the wound with fresh, clean, nonstick gauze and secure with paper tape. You may use Band-Aids in place of gauze and tape if the would is small enough, but would recommend trimming much of the tape off as there is often too much. Sometimes  Band-Aids can irritate the skin.  3. You should call the office for your biopsy report after 1 week if you have not already been contacted.  4. If you experience any problems, such as abnormal amounts of bleeding, swelling, significant bruising, significant pain, or evidence of infection, please call the office immediately.  5. FOR ADULT SURGERY PATIENTS: If you need something for pain relief you may take 1 extra strength Tylenol (acetaminophen) AND 2 Ibuprofen (200mg  each) together every 4 hours as needed for pain. (do not take these if you are  allergic to them or if you have a reason you should not take them.) Typically, you may only need pain medication for 1 to 3 days.    If you have an issue when the clinic is closed that cannot wait until the next business day, you can page your doctor at the number below.   Please note that while we do our best to be available for urgent issues outside of office hours, we are not available 24/7.  If you have a medical emergency and do not hear back from your doctor promptly, please seek medical care at your doctor's office, retail clinic, urgent care center or emergency room.  Pager Numbers  Dr. Nehemiah Massed: [phone]  Dr. Laurence Ferrari: [phone]  Dr. Nicole Kindred: [phone]

## 2020-08-31 ENCOUNTER — Telehealth: Payer: Self-pay

## 2020-08-31 NOTE — Telephone Encounter (Signed)
-----   Message from Alfonso Patten, MD sent at 08/27/2020  8:12 PM EST ----- 1. Skin , right lower back superior DYSPLASTIC JUNCTIONAL NEVUS WITH MODERATE ATYPIA, INFLAMED, LIMITED MARGINS FREE  This is a MODERATELY ATYPICAL MOLE. On the spectrum from normal mole to melanoma skin cancer, this is in between the two. - We need to recheck this area sometime in the next 6 months to be sure there is no evidence of the atypical mole coming back. If there is any color coming back, we would recommend repeating the biopsy to be sure the cells look normal.  - People who have a history of atypical moles do have a slightly increased risk of developing melanoma somewhere on the body, so a yearly full body skin exam by a dermatologist is recommended.  - Monthly self skin checks and daily sun protection are also recommended.  - Please call if you notice a dark spot coming back where this biopsy was taken.  - Please also call if you notice any new or changing spots anywhere else on the body before your follow-up visit.    2. Skin , right lower back inferior RECURRENT MELANOCYTIC NEVUS, LIMITED MARGINS FREE  This is a NORMAL MOLE coming back. No additional treatment is needed. If you notice any new or changing spots or have other skin concerns in future, please call our office at [phone].    MAs please call and schedule for recheck of mod atypia in next 6 months. Thank you!

## 2020-08-31 NOTE — Telephone Encounter (Signed)
Left message for pt to call for results and appt, JS

## 2020-09-02 ENCOUNTER — Other Ambulatory Visit: Payer: Self-pay | Admitting: Nurse Practitioner

## 2020-09-02 DIAGNOSIS — F5101 Primary insomnia: Secondary | ICD-10-CM

## 2020-09-04 ENCOUNTER — Encounter: Payer: Self-pay | Admitting: Dermatology

## 2020-09-28 ENCOUNTER — Ambulatory Visit (INDEPENDENT_AMBULATORY_CARE_PROVIDER_SITE_OTHER): Payer: BC Managed Care – PPO | Admitting: Hospice and Palliative Medicine

## 2020-09-28 ENCOUNTER — Encounter: Payer: Self-pay | Admitting: Hospice and Palliative Medicine

## 2020-09-28 VITALS — BP 100/76 | HR 75 | Temp 97.4°F | Resp 16 | Ht 64.0 in | Wt 146.4 lb

## 2020-09-28 DIAGNOSIS — Z1231 Encounter for screening mammogram for malignant neoplasm of breast: Secondary | ICD-10-CM

## 2020-09-28 DIAGNOSIS — Z79899 Other long term (current) drug therapy: Secondary | ICD-10-CM | POA: Diagnosis not present

## 2020-09-28 DIAGNOSIS — E2839 Other primary ovarian failure: Secondary | ICD-10-CM | POA: Diagnosis not present

## 2020-09-28 DIAGNOSIS — F411 Generalized anxiety disorder: Secondary | ICD-10-CM | POA: Diagnosis not present

## 2020-09-28 DIAGNOSIS — Z113 Encounter for screening for infections with a predominantly sexual mode of transmission: Secondary | ICD-10-CM

## 2020-09-28 DIAGNOSIS — K219 Gastro-esophageal reflux disease without esophagitis: Secondary | ICD-10-CM

## 2020-09-28 DIAGNOSIS — R300 Dysuria: Secondary | ICD-10-CM | POA: Diagnosis not present

## 2020-09-28 DIAGNOSIS — Z124 Encounter for screening for malignant neoplasm of cervix: Secondary | ICD-10-CM | POA: Diagnosis not present

## 2020-09-28 DIAGNOSIS — Z0001 Encounter for general adult medical examination with abnormal findings: Secondary | ICD-10-CM | POA: Diagnosis not present

## 2020-09-28 DIAGNOSIS — F5101 Primary insomnia: Secondary | ICD-10-CM

## 2020-09-28 LAB — POCT URINE DRUG SCREEN
Methylenedioxyamphetamine: NOT DETECTED
POC Amphetamine UR: NOT DETECTED
POC BENZODIAZEPINES UR: POSITIVE — AB
POC Barbiturate UR: NOT DETECTED
POC Cocaine UR: NOT DETECTED
POC Ecstasy UR: NOT DETECTED
POC Marijuana UR: NOT DETECTED
POC Methadone UR: NOT DETECTED
POC Methamphetamine UR: NOT DETECTED
POC Opiate Ur: NOT DETECTED
POC Oxycodone UR: NOT DETECTED
POC PHENCYCLIDINE UR: NOT DETECTED
POC TRICYCLICS UR: NOT DETECTED

## 2020-09-28 MED ORDER — CITALOPRAM HYDROBROMIDE 10 MG PO TABS
20.0000 mg | ORAL_TABLET | Freq: Every day | ORAL | 1 refills | Status: DC
Start: 2020-09-28 — End: 2020-12-29

## 2020-09-28 MED ORDER — OMEPRAZOLE 20 MG PO CPDR: 20.0000 mg | DELAYED_RELEASE_CAPSULE | Freq: Every day | ORAL | 1 refills | Status: DC

## 2020-09-28 MED ORDER — BUPROPION HCL ER (XL) 150 MG PO TB24: 150.0000 mg | ORAL_TABLET | Freq: Every day | ORAL | 1 refills | Status: DC

## 2020-09-28 MED ORDER — ZOLPIDEM TARTRATE 10 MG PO TABS: ORAL_TABLET | ORAL | 2 refills | Status: DC

## 2020-09-28 NOTE — Progress Notes (Signed)
[facility] [place], [place] [address]  Internal MEDICINE  Office Visit Note  Patient Name: Holly Le  [id number]  [id number]  Date of Service: 09/29/2020  Chief Complaint  Patient presents with  . Annual Exam    Review results, refill request, may have hernia   . Depression  . Diabetes  . Quality Metric Gaps    mammogram     HPI Pt is here for routine health maintenance examination Reviewed abdominal CT-possible gastritis, small umbilical hernia--CT performed due to reports of left upper quadrant, at that time was also complaining of N/V and diarrhea which have resolved at this time Concerned she had a hernia causing the protrusion in left upper quadrant  Complaining of increased stress and anxiety due to increasing demands from her job  Due for screening mammogram and bone density scanning Colonoscopy due 2019, will need updating 2024  Current Medication: Outpatient Encounter Medications as of 09/28/2020  Medication Sig  . ALPRAZolam (XANAX) 0.5 MG tablet Take 1 tablet (0.5 mg total) by mouth daily as needed.  Marland Kitchen buPROPion (WELLBUTRIN XL) 150 MG 24 hr tablet Take 1 tablet (150 mg total) by mouth daily.  . cyanocobalamin (,VITAMIN B-12,) 1000 MCG/ML injection Inject 37ml IM once weekly for 4 weeks then once monthly after that.  . ergocalciferol (DRISDOL) 1.25 MG (50000 UT) capsule Take one cap q week  . INS SYRINGE/NEEDLE 1CC/28G (GNP INSULIN SYRINGES 28GX1/2") 28G X 1/2" 1 ML MISC Use for vitamin B12 injections. once weekly for 4 weeks then once monthly.  . mupirocin ointment (BACTROBAN) 2 % Apply 1 application topically 3 (three) times daily.  Marland Kitchen omeprazole (PRILOSEC) 20 MG capsule Take 1 capsule (20 mg total) by mouth daily.  Marland Kitchen OTEZLA 30 MG TABS Take 1 tab twice a day every day as directed.  . [DISCONTINUED] citalopram (CELEXA) 10 MG tablet Take 2 tablets (20 mg total) by mouth daily.  . [DISCONTINUED] zolpidem (AMBIEN) 10 MG tablet TAKE ONE  TABLET AT BEDTIME AS NEEDED FORSLEEP  . citalopram (CELEXA) 10 MG tablet Take 2 tablets (20 mg total) by mouth daily.  Marland Kitchen zolpidem (AMBIEN) 10 MG tablet TAKE ONE TABLET AT BEDTIME AS NEEDED FORSLEEP  . [DISCONTINUED] nicotine (NICODERM CQ - DOSED IN MG/24 HOURS) 21 mg/24hr patch Place 1 patch (21 mg total) onto the skin daily. (Patient not taking: Reported on 09/28/2020)   No facility-administered encounter medications on file as of 09/28/2020.    Surgical History: Past Surgical History:  Procedure Laterality Date  . back injection    . CESAREAN SECTION     twins  . FOOT SURGERY    . LITHOTRIPSY    . SHOULDER SURGERY  01/2009   RIGHT    Medical History: Past Medical History:  Diagnosis Date  . Anxiety   . Depression   . Dysplastic nevus 10/14/2008   Lower back. Moderate atypia, extends to one edge.  Marland Kitchen Dysplastic nevus 10/07/2013   Right lower back. Moderate atypia, limited margins free.  [name]     Family History: Family History  Problem Relation Age of Onset  . Lymphoma Father   . Lymphoma Paternal Aunt   . Cancer Maternal Grandfather        COLON  . Breast cancer Paternal Aunt       Review of Systems  Constitutional: Negative for chills, diaphoresis and fatigue.  HENT: Negative for ear pain, postnasal drip and sinus pressure.   Eyes: Negative for photophobia, discharge, redness,  itching and visual disturbance.  Respiratory: Negative for cough, shortness of breath and wheezing.   Cardiovascular: Negative for chest pain, palpitations and leg swelling.  Gastrointestinal: Negative for abdominal pain, constipation, diarrhea, nausea and vomiting.  Genitourinary: Negative for dysuria and flank pain.  Musculoskeletal: Negative for arthralgias, back pain, gait problem and neck pain.  Skin: Negative for color change.  Allergic/Immunologic: Negative for environmental allergies and food allergies.  Neurological: Negative for dizziness and headaches.  Hematological:  Does not bruise/bleed easily.  Psychiatric/Behavioral: Positive for sleep disturbance. Negative for agitation, behavioral problems (depression) and hallucinations. The patient is nervous/anxious.      Vital Signs: BP 100/76   Pulse 75   Temp (!) 97.4 F (36.3 C)   Resp 16   Ht 5\' 4"  (1.626 m)   Wt 146 lb 6.4 oz (66.4 kg)   LMP 02/19/2017   SpO2 99%   BMI 25.13 kg/m    Physical Exam Vitals reviewed. Exam conducted with a chaperone present.  Constitutional:      Appearance: Normal appearance. She is normal weight.  Cardiovascular:     Rate and Rhythm: Normal rate and regular rhythm.     Pulses: Normal pulses.     Heart sounds: Normal heart sounds.  Pulmonary:     Effort: Pulmonary effort is normal.     Breath sounds: Normal breath sounds.  Chest:  Breasts:     Right: Normal.     Left: Normal.    Abdominal:     General: Abdomen is flat.     Palpations: Abdomen is soft.  Genitourinary:    Vagina: Normal.     Cervix: Normal.     Uterus: Normal.      Adnexa: Right adnexa normal and left adnexa normal.  Musculoskeletal:        General: Normal range of motion.     Cervical back: Normal range of motion.  Skin:    General: Skin is warm.  Neurological:     General: No focal deficit present.     Mental Status: She is alert and oriented to person, place, and time. Mental status is at baseline.  Psychiatric:        Mood and Affect: Mood normal.        Behavior: Behavior normal.        Thought Content: Thought content normal.        Judgment: Judgment normal.      LABS: Recent Results (from the past 2160 hour(s))  UA/M w/rflx Culture, Routine     Status: None   Collection Time: 09/28/20  8:29 AM   Specimen: Urine   Urine  Result Value Ref Range   Specific Gravity, UA 1.023 1.005 - 1.030   pH, UA 6.5 5.0 - 7.5   Color, UA Yellow Yellow   Appearance Ur Clear Clear   Leukocytes,UA Negative Negative   Protein,UA Negative Negative/Trace   Glucose, UA Negative  Negative   Ketones, UA Negative Negative   RBC, UA Negative Negative   Bilirubin, UA Negative Negative   Urobilinogen, Ur 0.2 0.2 - 1.0 mg/dL   Nitrite, UA Negative Negative   Microscopic Examination Comment     Comment: Microscopic follows if indicated.   Microscopic Examination See below:     Comment: Microscopic was indicated and was performed.   Urinalysis Reflex Comment     Comment: This specimen will not reflex to a Urine Culture.  Microscopic Examination     Status: None   Collection Time: 09/28/20  8:29 AM   Urine  Result Value Ref Range   WBC, UA None seen 0 - 5 /hpf   RBC 0-2 0 - 2 /hpf   Epithelial Cells (non renal) 0-10 0 - 10 /hpf   Casts None seen None seen /lpf   Bacteria, UA Few None seen/Few  POCT Urine Drug Screen     Status: Abnormal   Collection Time: 09/28/20 12:16 PM  Result Value Ref Range   POC Methamphetamine UR None Detected None Detected   POC Opiate Ur None Detected None Detected   POC Barbiturate UR None Detected None Detected   POC Amphetamine UR None Detected None Detected   POC Oxycodone UR None Detected None Detected   POC Cocaine UR None Detected None Detected   POC Ecstasy UR None Detected None Detected   POC TRICYCLICS UR None Detected None Detected   POC PHENCYCLIDINE UR None Detected None Detected   POC Marijuana UR None Detected None Detected   POC Methadone UR None Detected None Detected   POC BENZODIAZEPINES UR Positive (A) None Detected   URINE TEMPERATURE     POC DRUG SCREEN OXIDANTS URINE     POC SPECIFIC GRAVITY URINE     POC PH URINE     Methylenedioxyamphetamine None Detected None Detected     Assessment/Plan: 1. Encounter for routine adult health examination with abnormal findings Well appearing 55 year old female Order placed to update PHM  2. Encounter for screening mammogram for malignant neoplasm of breast - MM Digital Screening; Future  3. Primary ovarian failure - DG Bone Density; Future  4. Generalized  anxiety disorder Add Wellbutrin for increased levels of anxiety, continue with Celexa, continue to monitor - citalopram (CELEXA) 10 MG tablet; Take 2 tablets (20 mg total) by mouth daily.  Dispense: 180 tablet; Refill: 1 - buPROPion (WELLBUTRIN XL) 150 MG 24 hr tablet; Take 1 tablet (150 mg total) by mouth daily.  Dispense: 90 tablet; Refill: 1  5. Primary insomnia Stable, takes as needed for insomnia, requesting refills [place] Controlled Substance Database was reviewed by me for overdose risk score (ORS) Reviewed risks and possible side effects associated with taking opiates, benzodiazepines and other CNS depressants. Combination of these could cause dizziness and drowsiness. Advised patient not to drive or operate machinery when taking these medications, as patient's and other's life can be at risk and will have consequences. Patient verbalized understanding in this matter. Dependence and abuse for these drugs will be monitored closely. A Controlled substance policy and procedure is on file which allows [facility] to order a urine drug screen test at any visit. Patient understands and agrees with the plan - zolpidem (AMBIEN) 10 MG tablet; TAKE ONE TABLET AT BEDTIME AS NEEDED FORSLEEP  Dispense: 30 tablet; Refill: 2  6. Gastroesophageal reflux disease without esophagitis Stable, requesting refills - omeprazole (PRILOSEC) 20 MG capsule; Take 1 capsule (20 mg total) by mouth daily.  Dispense: 90 capsule; Refill: 1  7. Dysuria - UA/M w/rflx Culture, Routine - Microscopic Examination  8. Encounter for long-term (current) use of high-risk medication - POCT Urine Drug Screen  9. Screening for STD (sexually transmitted disease) - NuSwab Vaginitis Plus (VG+)  10. Routine cervical smear - IGP, Aptima HPV  General Counseling: Holly Le verbalizes understanding of the findings of todays visit and agrees with plan of treatment. I have discussed any further diagnostic evaluation that may be  needed or ordered today. We also reviewed her medications today. she has been encouraged  to call the office with any questions or concerns that should arise related to todays visit.    Counseling:    Orders Placed This Encounter  Procedures  . Microscopic Examination  . MM Digital Screening  . DG Bone Density  . UA/M w/rflx Culture, Routine  . NuSwab Vaginitis Plus (VG+)  . POCT Urine Drug Screen    Meds ordered this encounter  Medications  . zolpidem (AMBIEN) 10 MG tablet    Sig: TAKE ONE TABLET AT BEDTIME AS NEEDED FORSLEEP    Dispense:  30 tablet    Refill:  2    Please send back to pharmacy and let them know they need to send requests to [facility]. Thanks.  . citalopram (CELEXA) 10 MG tablet    Sig: Take 2 tablets (20 mg total) by mouth daily.    Dispense:  180 tablet    Refill:  1  . buPROPion (WELLBUTRIN XL) 150 MG 24 hr tablet    Sig: Take 1 tablet (150 mg total) by mouth daily.    Dispense:  90 tablet    Refill:  1  . omeprazole (PRILOSEC) 20 MG capsule    Sig: Take 1 capsule (20 mg total) by mouth daily.    Dispense:  90 capsule    Refill:  1    Total time spent: 30 Minutes  Time spent includes review of chart, medications, test results, and follow up plan with the patient.   This patient was seen by Theodoro Grist AGNP-C Collaboration with Dr Lavera Guise as a part of collaborative care agreement   Tanna Furry. [facility] Internal Medicine

## 2020-09-29 ENCOUNTER — Encounter: Payer: Self-pay | Admitting: Hospice and Palliative Medicine

## 2020-09-29 LAB — UA/M W/RFLX CULTURE, ROUTINE
Bilirubin, UA: NEGATIVE
Glucose, UA: NEGATIVE
Ketones, UA: NEGATIVE
Leukocytes,UA: NEGATIVE
Nitrite, UA: NEGATIVE
Protein,UA: NEGATIVE
RBC, UA: NEGATIVE
Specific Gravity, UA: 1.023 (ref 1.005–1.030)
Urobilinogen, Ur: 0.2 mg/dL (ref 0.2–1.0)
pH, UA: 6.5 (ref 5.0–7.5)

## 2020-09-29 LAB — MICROSCOPIC EXAMINATION
Casts: NONE SEEN /lpf
WBC, UA: NONE SEEN /hpf (ref 0–5)

## 2020-09-30 LAB — NUSWAB VAGINITIS PLUS (VG+)
Candida albicans, NAA: NEGATIVE
Candida glabrata, NAA: NEGATIVE
Chlamydia trachomatis, NAA: NEGATIVE
Neisseria gonorrhoeae, NAA: NEGATIVE
Trich vag by NAA: NEGATIVE

## 2020-10-03 LAB — IGP, APTIMA HPV: HPV Aptima: NEGATIVE

## 2020-10-03 NOTE — Progress Notes (Signed)
Normal PAP

## 2020-10-19 ENCOUNTER — Other Ambulatory Visit: Payer: Self-pay | Admitting: Dermatology

## 2020-10-19 DIAGNOSIS — L409 Psoriasis, unspecified: Secondary | ICD-10-CM

## 2020-10-25 ENCOUNTER — Other Ambulatory Visit: Payer: Self-pay

## 2020-10-25 ENCOUNTER — Ambulatory Visit
Admission: EM | Admit: 2020-10-25 | Discharge: 2020-10-25 | Disposition: A | Discharge status: Home / Self Care | Payer: BC Managed Care – PPO | Attending: Internal Medicine | Admitting: Internal Medicine

## 2020-10-25 ENCOUNTER — Encounter: Payer: Self-pay | Admitting: Emergency Medicine

## 2020-10-25 DIAGNOSIS — R058 Other specified cough: Secondary | ICD-10-CM | POA: Diagnosis not present

## 2020-10-25 MED ORDER — BENZONATATE 100 MG PO CAPS: 100.0000 mg | ORAL_CAPSULE | Freq: Three times a day (TID) | ORAL | 0 refills | Status: DC

## 2020-10-25 NOTE — Discharge Instructions (Signed)
Please take medications as prescribed °If you have worsening symptoms please return to urgent care to be reevaluated. ° °

## 2020-10-25 NOTE — ED Triage Notes (Addendum)
Patient c/o productive cough w/ "white" phlegm.   Patient endorses fever and muscle pain upon onset of symptoms.  Patient took an at home COVID test upon onset of symptoms and reports a negative test result.   Patient denies SOB, Chest Pain, or Tobacco Use.   Patient has tried multiple OTC medications with no relief of symptoms.

## 2020-10-25 NOTE — ED Provider Notes (Signed)
Holly Le    CSN: [id number] Arrival date & time: 10/25/20  1010      History   Chief Complaint Chief Complaint  Patient presents with  . Cough    HPI Holly Le is a 55 y.o. female comes to the urgent care with complaints of cough productive of whitish sputum.  Patient's symptoms started about 10 days ago.  At the outset patient had a fever which subsequently subsided.  Cough is generally lingering symptom at this time.  She denies any shortness of breath, wheezing, fever or chills.  No nausea or vomiting or diarrhea.  No sick contacts.  No chest pain or chest pressure.Marland Kitchen   HPI  Past Medical History:  Diagnosis Date  . Anxiety   . Depression   . Dysplastic nevus 10/14/2008   Lower back. Moderate atypia, extends to one edge.  Marland Kitchen Dysplastic nevus 10/07/2013   Right lower back. Moderate atypia, limited margins free.  Holly Le disease     Patient Active Problem List   Diagnosis Date Noted  . Chest pain 05/29/2020  . Abnormal ECG 05/29/2020  . Gastroenteritis and colitis, viral 05/29/2020  . Generalized abdominal tenderness without rebound tenderness 05/29/2020  . Intractable vomiting 05/29/2020  . History of nicotine vaping 05/29/2020  . History of irregular menstrual cycles 09/27/2019  . Other fatigue 06/30/2019  . Generalized anxiety disorder 04/02/2018  . Flu vaccine need 04/02/2018  . Candidosis 12/17/2017  . Primary insomnia 12/17/2017  . Dysuria 12/17/2017  . Lumbar disc disease with radiculopathy 06/26/2017  . Cervical disc disorder at C5-C6 level with radiculopathy 06/26/2017  . Encounter for screening mammogram for malignant neoplasm of breast 06/26/2017  . Psoriasis, unspecified 06/26/2017  . 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 Polyarthralgia 06/26/2017  . Psoriasis 06/26/2017    Past Surgical History:  Procedure Laterality Date  . back injection    . CESAREAN SECTION     twins  . FOOT SURGERY    . LITHOTRIPSY    . SHOULDER SURGERY   01/2009   RIGHT    OB History   No obstetric history on file.      Home Medications    Prior to Admission medications   Medication Sig Start Date End Date Taking? Authorizing Provider  Apremilast (OTEZLA) 30 MG TABS TAKE 1 TABLET BY MOUTH TWICE DAILY AS DIRECTED 10/20/20  Yes Moye, [place], MD  benzonatate (TESSALON) 100 MG capsule Take 1 capsule (100 mg total) by mouth every 8 (eight) hours. 10/25/20  Yes Katriel Cutsforth, Myrene Galas, MD  zolpidem (AMBIEN) 10 MG tablet TAKE ONE TABLET AT BEDTIME AS NEEDED FORSLEEP 09/28/20  Yes Luiz Ochoa, NP  ALPRAZolam Duanne Moron) 0.5 MG tablet Take 1 tablet (0.5 mg total) by mouth daily as needed. 01/27/20   Lavera Guise, MD  citalopram (CELEXA) 10 MG tablet Take 2 tablets (20 mg total) by mouth daily. 09/28/20   Luiz Ochoa, NP  cyanocobalamin (,VITAMIN B-12,) 1000 MCG/ML injection Inject 45ml IM once weekly for 4 weeks then once monthly after that. 06/21/20   Ronnell Freshwater, NP  ergocalciferol (DRISDOL) 1.25 MG (50000 UT) capsule Take one cap q week 01/27/20   Lavera Guise, MD  INS SYRINGE/NEEDLE 1CC/28G [facility] INSULIN SYRINGES 28GX1/2") 28G X 1/2" 1 ML MISC Use for vitamin B12 injections. once weekly for 4 weeks then once monthly. 06/21/20   Ronnell Freshwater, NP  mupirocin ointment (BACTROBAN) 2 % Apply 1 application topically 3 (three) times daily. 08/23/20  Moye, [place], MD  buPROPion (WELLBUTRIN XL) 150 MG 24 hr tablet Take 1 tablet (150 mg total) by mouth daily. 09/28/20 10/25/20  Luiz Ochoa, NP  omeprazole (PRILOSEC) 20 MG capsule Take 1 capsule (20 mg total) by mouth daily. 09/28/20 10/25/20  Luiz Ochoa, NP    Family History Family History  Problem Relation Age of Onset  . Lymphoma Father   . Lymphoma Paternal Aunt   . Cancer Maternal Grandfather        COLON  . Breast cancer Paternal Aunt     Social History Social History   Tobacco Use  . Smoking status: Former Smoker    Types: E-cigarettes    Quit date: 08/25/2020    Years since  quitting: 0.1  . Smokeless tobacco: Never Used  . Tobacco comment: 5 a days   Vaping Use  . Vaping Use: Every day  . Devices: juul  Substance Use Topics  . Alcohol use: Yes    Comment: occasionally  . Drug use: No     Allergies   Codeine, Oxycodone-acetaminophen, Percocet [oxycodone-acetaminophen], Sulfa antibiotics, and Sulfasalazine   Review of Systems Review of Systems  HENT: Negative.   Respiratory: Negative.   Cardiovascular: Negative.  Negative for chest pain.     Physical Exam Triage Vital Signs ED Triage Vitals  Enc Vitals Group     BP 10/25/20 1035 95/68     Pulse Rate 10/25/20 1035 84     Resp 10/25/20 1035 19     Temp 10/25/20 1035 98.9 F (37.2 C)     Temp Source 10/25/20 1035 Oral     SpO2 10/25/20 1035 97 %     Weight --      Height --      Head Circumference --      Peak Flow --      Pain Score 10/25/20 1032 0     Pain Loc --      Pain Edu? --      Excl. in [place]? --    No data found.  Updated Vital Signs BP 95/68 (BP Location: Left Arm)   Pulse 84   Temp 98.9 F (37.2 C) (Oral)   Resp 19   LMP 02/19/2017   SpO2 97%   Visual Acuity Right Eye Distance:   Left Eye Distance:   Bilateral Distance:    Right Eye Near:   Left Eye Near:    Bilateral Near:     Physical Exam Vitals and nursing note reviewed.  Constitutional:      General: She is not in acute distress.    Appearance: She is not ill-appearing.  HENT:     Right Ear: Tympanic membrane normal.     Left Ear: Tympanic membrane normal.     Mouth/Throat:     Mouth: Mucous membranes are moist.  Eyes:     Extraocular Movements: Extraocular movements intact.  Cardiovascular:     Rate and Rhythm: Normal rate and regular rhythm.     Pulses: Normal pulses.     Heart sounds: Normal heart sounds.  Pulmonary:     Effort: Pulmonary effort is normal.     Breath sounds: Normal breath sounds. No wheezing or rhonchi.  Musculoskeletal:        General: Normal range of motion.   Neurological:     Mental Status: She is alert.      UC Treatments / Results  Labs (all labs ordered are listed, but only abnormal results are displayed)  Labs Reviewed - No data to display  EKG   Radiology No results found.  Procedures Procedures (including critical care time)  Medications Ordered in UC Medications - No data to display  Initial Impression / Assessment and Plan / UC Course  I have reviewed the triage vital signs and the nursing notes.  Pertinent labs & imaging results that were available during my care of the patient were reviewed by me and considered in my medical decision making (see chart for details).     1.  Post viral cough syndrome: Tessalon Perles as needed for cough Increase oral fluid intake Lung exam sounds clear No indication for chest x-ray at this time Return to urgent care if symptoms worsen. Final Clinical Impressions(s) / UC Diagnoses   Final diagnoses:  Post-viral cough syndrome     Discharge Instructions     Please take medications as prescribed If you have worsening symptoms please return to urgent care to be reevaluated   ED Prescriptions    Medication Sig Dispense Auth. Provider   benzonatate (TESSALON) 100 MG capsule Take 1 capsule (100 mg total) by mouth every 8 (eight) hours. 30 capsule Datra Clary, Myrene Galas, MD     PDMP not reviewed this encounter.   Chase Picket, MD 10/25/20 1534

## 2020-12-29 ENCOUNTER — Other Ambulatory Visit: Payer: Self-pay

## 2020-12-29 ENCOUNTER — Encounter: Payer: Self-pay | Admitting: Nurse Practitioner

## 2020-12-29 ENCOUNTER — Ambulatory Visit: Payer: BC Managed Care – PPO | Admitting: Nurse Practitioner

## 2020-12-29 VITALS — BP 101/66 | HR 70 | Temp 98.5°F | Resp 16 | Ht 64.0 in | Wt 144.2 lb

## 2020-12-29 DIAGNOSIS — E785 Hyperlipidemia, unspecified: Secondary | ICD-10-CM

## 2020-12-29 DIAGNOSIS — E559 Vitamin D deficiency, unspecified: Secondary | ICD-10-CM | POA: Diagnosis not present

## 2020-12-29 DIAGNOSIS — Z0189 Encounter for other specified special examinations: Secondary | ICD-10-CM

## 2020-12-29 DIAGNOSIS — F5101 Primary insomnia: Secondary | ICD-10-CM

## 2020-12-29 DIAGNOSIS — F411 Generalized anxiety disorder: Secondary | ICD-10-CM

## 2020-12-29 DIAGNOSIS — Z13820 Encounter for screening for osteoporosis: Secondary | ICD-10-CM

## 2020-12-29 DIAGNOSIS — Z1231 Encounter for screening mammogram for malignant neoplasm of breast: Secondary | ICD-10-CM

## 2020-12-29 MED ORDER — CITALOPRAM HYDROBROMIDE 10 MG PO TABS: 10.0000 mg | ORAL_TABLET | Freq: Every day | ORAL | 2 refills | Status: DC

## 2020-12-29 MED ORDER — CITALOPRAM HYDROBROMIDE 20 MG PO TABS: 20.0000 mg | ORAL_TABLET | Freq: Every day | ORAL | 2 refills | Status: DC

## 2020-12-29 MED ORDER — ESZOPICLONE 1 MG PO TABS: 1.0000 mg | ORAL_TABLET | Freq: Every day | ORAL | 0 refills | Status: DC

## 2020-12-29 NOTE — Progress Notes (Signed)
[facility] [place], [place] [address]  Internal MEDICINE  Office Visit Note  Patient Name: GIANINA OLINDE  [id number]  [id number]  Date of Service: 12/29/2020  Chief Complaint  Patient presents with   Depression   Anxiety   Follow-up    Med review, patient still having trouble sleeping   Quality Metric Gaps    Mammogram     HPI Denyla present for a follow up visit for depression, anxiety, and medication review. She is still have trouble sleeping per patient report. She is also due for a screening mammogram. He has a history of grave's disease and hypothyroidism, right shoulder surgery and a C-section for twins.  -Due for mammogram, bone density, and routine labs.  -has been taking ambien for sleep but it is not helping, would like to try something different.  -requesting to increase dose of citalopram, reports that current dose is not controlling her anxiety adequately. Was taking bupropion but states it was not helping at all.  -takes otezla for psoriasis.    Current Medication: Outpatient Encounter Medications as of 12/29/2020  Medication Sig   ALPRAZolam (XANAX) 0.5 MG tablet Take 1 tablet (0.5 mg total) by mouth daily as needed.   Apremilast (OTEZLA) 30 MG TABS TAKE 1 TABLET BY MOUTH TWICE DAILY AS DIRECTED   citalopram (CELEXA) 10 MG tablet Take 1 tablet (10 mg total) by mouth daily.   citalopram (CELEXA) 20 MG tablet Take 1 tablet (20 mg total) by mouth daily.   cyanocobalamin (,VITAMIN B-12,) 1000 MCG/ML injection Inject 74m IM once weekly for 4 weeks then once monthly after that.   eszopiclone (LUNESTA) 1 MG TABS tablet Take 1 tablet (1 mg total) by mouth at bedtime. Take immediately before bedtime   INS SYRINGE/NEEDLE 1CC/28G (GNP INSULIN SYRINGES 28GX1/2") 28G X 1/2" 1 ML MISC Use for vitamin B12 injections. once weekly for 4 weeks then once monthly.   zolpidem (AMBIEN) 10 MG tablet TAKE ONE TABLET AT BEDTIME AS NEEDED FORSLEEP    [DISCONTINUED] citalopram (CELEXA) 10 MG tablet Take 2 tablets (20 mg total) by mouth daily.   ergocalciferol (DRISDOL) 1.25 MG (50000 UT) capsule Take one cap q week (Patient not taking: Reported on 12/29/2020)   [DISCONTINUED] benzonatate (TESSALON) 100 MG capsule Take 1 capsule (100 mg total) by mouth every 8 (eight) hours. (Patient not taking: Reported on 12/29/2020)   [DISCONTINUED] buPROPion (WELLBUTRIN XL) 150 MG 24 hr tablet Take 1 tablet (150 mg total) by mouth daily.   [DISCONTINUED] mupirocin ointment (BACTROBAN) 2 % Apply 1 application topically 3 (three) times daily. (Patient not taking: Reported on 12/29/2020)   [DISCONTINUED] omeprazole (PRILOSEC) 20 MG capsule Take 1 capsule (20 mg total) by mouth daily.   No facility-administered encounter medications on file as of 12/29/2020.    Surgical History: Past Surgical History:  Procedure Laterality Date   back injection     CESAREAN SECTION     twins   FOOT SURGERY     LITHOTRIPSY     SHOULDER SURGERY  01/2009   RIGHT    Medical History: Past Medical History:  Diagnosis Date   Anxiety    Depression    Dysplastic nevus 10/14/2008   Lower back. Moderate atypia, extends to one edge.   Dysplastic nevus 10/07/2013   Right lower back. Moderate atypia, limited margins free.   Grave's disease     Family History: Family History  Problem Relation Age of Onset   Lymphoma Father  Lymphoma Paternal Aunt    Cancer Maternal Grandfather        COLON   Breast cancer Paternal Aunt     Social History   Socioeconomic History   Marital status: Married    Spouse name: Not on file   Number of children: Not on file   Years of education: Not on file   Highest education level: Not on file  Occupational History   Not on file  Tobacco Use   Smoking status: Former    Types: E-cigarettes    Quit date: 08/25/2020    Years since quitting: 0.3   Smokeless tobacco: Never   Tobacco comments:    5 a days   Vaping Use   Vaping Use: Every  day   Devices: juul  Substance and Sexual Activity   Alcohol use: Yes    Comment: occasionally   Drug use: No   Sexual activity: Not on file  Other Topics Concern   Not on file  Social History Narrative   ** Merged History Encounter **       Social Determinants of Health   Financial Resource Strain: Not on file  Food Insecurity: Not on file  Transportation Needs: Not on file  Physical Activity: Not on file  Stress: Not on file  Social Connections: Not on file  Intimate Partner Violence: Not on file      Review of Systems  Constitutional:  Negative for chills, fatigue and unexpected weight change.  HENT:  Negative for congestion, rhinorrhea, sneezing and sore throat.   Eyes:  Negative for redness.  Respiratory:  Negative for cough, chest tightness and shortness of breath.   Cardiovascular:  Negative for chest pain and palpitations.  Gastrointestinal:  Negative for abdominal pain, constipation, diarrhea, nausea and vomiting.  Genitourinary:  Negative for dysuria and frequency.  Musculoskeletal:  Negative for arthralgias, back pain, joint swelling and neck pain.  Skin:  Negative for rash.  Neurological: Negative.  Negative for tremors and numbness.  Hematological:  Negative for adenopathy. Does not bruise/bleed easily.  Psychiatric/Behavioral:  Positive for behavioral problems (Depression), dysphoric mood and sleep disturbance. Negative for self-injury and suicidal ideas. The patient is nervous/anxious.    Vital Signs: BP 101/66   Pulse 70   Temp 98.5 F (36.9 C)   Resp 16   Ht '5\' 4"'  (1.626 m)   Wt 144 lb 3.2 oz (65.4 kg)   LMP 02/19/2017   SpO2 95%   BMI 24.75 kg/m    Physical Exam Constitutional:      General: She is not in acute distress.    Appearance: Normal appearance. She is well-developed and normal weight. She is not ill-appearing or diaphoretic.  HENT:     Head: Normocephalic and atraumatic.  Neck:     Thyroid: No thyromegaly.     Vascular: No JVD.      Trachea: No tracheal deviation.  Cardiovascular:     Rate and Rhythm: Normal rate and regular rhythm.     Pulses: Normal pulses.     Heart sounds: Normal heart sounds. No murmur heard.   No friction rub. No gallop.  Pulmonary:     Effort: Pulmonary effort is normal. No respiratory distress.     Breath sounds: Normal breath sounds. No wheezing or rales.  Chest:     Chest wall: No tenderness.  Lymphadenopathy:     Cervical: No cervical adenopathy.  Skin:    General: Skin is warm and dry.     Capillary  Refill: Capillary refill takes less than 2 seconds.  Neurological:     Mental Status: She is alert and oriented to person, place, and time.     Cranial Nerves: No cranial nerve deficit.  Psychiatric:        Mood and Affect: Affect normal. Mood is anxious.        Behavior: Behavior normal.     Assessment/Plan: 1. Generalized anxiety disorder Anxiety not well controlled, citalopram dose increase to 30 mg daily. Reevaluate in 1 month to determine if any further dose adjustment is necessary.  - citalopram (CELEXA) 10 MG tablet; Take 1 tablet (10 mg total) by mouth daily.  Dispense: 30 tablet; Refill: 2 - citalopram (CELEXA) 20 MG tablet; Take 1 tablet (20 mg total) by mouth daily.  Dispense: 30 tablet; Refill: 2  2. Primary insomnia Ambien was not helping the patient sleep, she is interested in trying something else. Lunesta prescribed and sent to pharmacy. Reevaluate in 1 month to determine if insomnia is adequately controlled.  - eszopiclone (LUNESTA) 1 MG TABS tablet; Take 1 tablet (1 mg total) by mouth at bedtime. Take immediately before bedtime  Dispense: 30 tablet; Refill: 0  3. Vitamin D deficiency History of low vitamin D, was previously taking weekly 50,000 unit vitamin D supplement. Recheck level to determine if supplementation is still necessary.  - Vitamin D (25 hydroxy)  4. Hyperlipidemia, unspecified hyperlipidemia type Not currently taking any medication for  elevated cholesterol. Lipid panel ordered to recheck levels.  - Lipid Profile  5. Encounter for screening mammogram for malignant neoplasm of breast Routine screening mammogram ordered.  - MM Digital Screening; Future  6. Encounter for routine laboratory testing Routine labs ordered to compare to previous labs.  - CBC with Differential/Platelet - CMP14+EGFR - TSH + free T4  7. Screening for osteoporosis Routine bone density scan ordered.  - DG Bone Density; Future   General Counseling: kelita wallis understanding of the findings of todays visit and agrees with plan of treatment. I have discussed any further diagnostic evaluation that may be needed or ordered today. We also reviewed her medications today. she has been encouraged to call the office with any questions or concerns that should arise related to todays visit.    Orders Placed This Encounter  Procedures   MM Digital Screening   DG Bone Density   CBC with Differential/Platelet   CMP14+EGFR   Vitamin D (25 hydroxy)   TSH + free T4   Lipid Profile    Meds ordered this encounter  Medications   citalopram (CELEXA) 10 MG tablet    Sig: Take 1 tablet (10 mg total) by mouth daily.    Dispense:  30 tablet    Refill:  2    To be taken with the 20 mg tablet for a daily dose of 30 mg   citalopram (CELEXA) 20 MG tablet    Sig: Take 1 tablet (20 mg total) by mouth daily.    Dispense:  30 tablet    Refill:  2    To be taken with the 10 mg tablet for a daily dose of 30 mg   eszopiclone (LUNESTA) 1 MG TABS tablet    Sig: Take 1 tablet (1 mg total) by mouth at bedtime. Take immediately before bedtime    Dispense:  30 tablet    Refill:  0    Return in about 6 months (around 07/01/2021) for F/U, med refill, Corinne Goucher PCP. For in person F/U; need patient to  have informal follow up to determine efficacy of dose adjustments and new medication lunesta, will request telehealth visit to be scheduled in 1 month.    Total time  spent:30 Minutes Time spent includes review of chart, medications, test results, and follow up plan with the patient.   [place] Controlled Substance Database was reviewed by me.  This patient was seen by Jonetta Osgood, FNP-C in collaboration with Dr. Clayborn Bigness as a part of collaborative care agreement.   Karla Vines R. Valetta Fuller, MSN, FNP-C Internal medicine

## 2021-01-11 ENCOUNTER — Other Ambulatory Visit: Payer: Self-pay | Admitting: Nurse Practitioner

## 2021-01-11 ENCOUNTER — Encounter: Payer: Self-pay | Admitting: Nurse Practitioner

## 2021-01-11 DIAGNOSIS — F5101 Primary insomnia: Secondary | ICD-10-CM

## 2021-01-11 MED ORDER — BELSOMRA 15 MG PO TABS: 15.0000 mg | ORAL_TABLET | Freq: Every evening | ORAL | 0 refills | Status: DC | PRN

## 2021-01-11 NOTE — Progress Notes (Signed)
Holly Le is not working per patient report, she tried a sample of belsomra that was provided to her at her previous office visit and said it worked much better. Belsomra prescription sent to pharmacy, patient will return the rest of the lunesta prescription to the pharmacy to be disposed of properly.

## 2021-02-13 ENCOUNTER — Other Ambulatory Visit: Payer: Self-pay | Admitting: Nurse Practitioner

## 2021-02-13 DIAGNOSIS — F5101 Primary insomnia: Secondary | ICD-10-CM

## 2021-02-13 NOTE — Telephone Encounter (Signed)
Called patient to schedule 3 mo follow up. She stated she does not want to come every 3  months since she is healthy and does not want to pay copay every 3 months.

## 2021-04-06 ENCOUNTER — Other Ambulatory Visit: Payer: Self-pay | Admitting: Internal Medicine

## 2021-04-06 ENCOUNTER — Other Ambulatory Visit: Payer: Self-pay | Admitting: Nurse Practitioner

## 2021-04-06 DIAGNOSIS — F5101 Primary insomnia: Secondary | ICD-10-CM

## 2021-04-06 NOTE — Telephone Encounter (Signed)
Med sent.

## 2021-04-16 ENCOUNTER — Other Ambulatory Visit: Payer: Self-pay | Admitting: Nurse Practitioner

## 2021-04-16 ENCOUNTER — Telehealth: Payer: Self-pay

## 2021-04-16 DIAGNOSIS — Z1231 Encounter for screening mammogram for malignant neoplasm of breast: Secondary | ICD-10-CM

## 2021-04-18 ENCOUNTER — Other Ambulatory Visit: Payer: Self-pay | Admitting: Nurse Practitioner

## 2021-04-18 DIAGNOSIS — N649 Disorder of breast, unspecified: Secondary | ICD-10-CM

## 2021-04-18 NOTE — Telephone Encounter (Signed)
LMOM informing pt that a diagnostic bilateral mammo was ordered

## 2021-04-20 ENCOUNTER — Encounter: Payer: Self-pay | Admitting: Nurse Practitioner

## 2021-04-20 ENCOUNTER — Other Ambulatory Visit: Payer: Self-pay | Admitting: Nurse Practitioner

## 2021-04-20 DIAGNOSIS — Z13820 Encounter for screening for osteoporosis: Secondary | ICD-10-CM

## 2021-04-20 DIAGNOSIS — N649 Disorder of breast, unspecified: Secondary | ICD-10-CM

## 2021-05-01 ENCOUNTER — Ambulatory Visit: Payer: BC Managed Care – PPO | Admitting: Dermatology

## 2021-05-01 ENCOUNTER — Other Ambulatory Visit: Payer: Self-pay

## 2021-05-01 DIAGNOSIS — D369 Benign neoplasm, unspecified site: Secondary | ICD-10-CM

## 2021-05-01 DIAGNOSIS — L299 Pruritus, unspecified: Secondary | ICD-10-CM

## 2021-05-01 DIAGNOSIS — L409 Psoriasis, unspecified: Secondary | ICD-10-CM

## 2021-05-01 DIAGNOSIS — L988 Other specified disorders of the skin and subcutaneous tissue: Secondary | ICD-10-CM | POA: Diagnosis not present

## 2021-05-01 DIAGNOSIS — D2361 Other benign neoplasm of skin of right upper limb, including shoulder: Secondary | ICD-10-CM

## 2021-05-01 MED ORDER — TACROLIMUS 0.1 % EX OINT: TOPICAL_OINTMENT | Freq: Two times a day (BID) | CUTANEOUS | 2 refills | Status: DC

## 2021-05-01 MED ORDER — CLOBETASOL PROPIONATE 0.05 % EX FOAM: Freq: Two times a day (BID) | CUTANEOUS | 2 refills | Status: DC

## 2021-05-01 NOTE — Patient Instructions (Addendum)
Recommend OTC Gold Bond Rapid Relief Anti-Itch cream (pramoxine + menthol), CeraVe Anti-itch cream or lotion (pramoxine), Sarna lotion (Original- menthol + camphor or Sensitive- pramoxine) or Eucerin 12 hour Itch Relief lotion (menthol) up to 3 times per day to areas on body that are itchy.  Psoriasis is a chronic non-curable, but treatable genetic/hereditary disease that may have other systemic features affecting other organ systems such as joints (Psoriatic Arthritis). It is associated with an increased risk of inflammatory bowel disease, heart disease, non-alcoholic fatty liver disease, and depression.    Restart clobetasol foam twice daily for 2-3 weeks to affected areas at scalp. For area at arm, use once daily and cover. Avoid applying to face, groin, and axilla. Use as directed. Long-term use can cause thinning of the skin.  Topical steroids (such as triamcinolone, fluocinolone, fluocinonide, mometasone, clobetasol, halobetasol, betamethasone, hydrocortisone) can cause thinning and lightening of the skin if they are used for too long in the same area. Your physician has selected the right strength medicine for your problem and area affected on the body. Please use your medication only as directed by your physician to prevent side effects.   Cryotherapy Aftercare  Wash gently with soap and water everyday.   Apply Vaseline and Band-Aid daily until healed.   Prior to procedure, discussed risks of blister formation, small wound, skin dyspigmentation, or rare scar following cryotherapy. Recommend Vaseline ointment to treated areas while healing.  If you have any questions or concerns for your doctor, please call our main line at [phone] and press option 4 to reach your doctor's medical assistant. If no one answers, please leave a voicemail as directed and we will return your call as soon as possible. Messages left after 4 pm will be answered the following business day.   You may also send [place]  a message via [place]. We typically respond to MyChart messages within 1-2 business days.  For prescription refills, please ask your pharmacy to contact our office. Our fax number is [phone].  If you have an urgent issue when the clinic is closed that cannot wait until the next business day, you can page your doctor at the number below.    Please note that while we do our best to be available for urgent issues outside of office hours, we are not available 24/7.   If you have an urgent issue and are unable to reach [place], you may choose to seek medical care at your doctor's office, retail clinic, urgent care center, or emergency room.  If you have a medical emergency, please immediately call 911 or go to the emergency department.  Pager Numbers  - Dr. Nehemiah Massed: [phone]  - Dr. Laurence Ferrari: [phone]  - Dr. Nicole Kindred: [phone]  In the event of inclement weather, please call our main line at [phone] for an update on the status of any delays or closures.  Dermatology Medication Tips: Please keep the boxes that topical medications come in in order to help keep track of the instructions about where and how to use these. Pharmacies typically print the medication instructions only on the boxes and not directly on the medication tubes.   If your medication is too expensive, please contact our office at [phone] option 4 or send [place] a message through [place].   We are unable to tell what your co-pay for medications will be in advance as this is different depending on your insurance coverage. However, we may be able to find a substitute medication at lower cost or  fill out paperwork to get insurance to cover a needed medication.   If a prior authorization is required to get your medication covered by your insurance company, please allow [place] 1-2 business days to complete this process.  Drug prices often vary depending on where the prescription is filled and some pharmacies may offer  cheaper prices.  The website www.goodrx.com contains coupons for medications through different pharmacies. The prices here do not account for what the cost may be with help from insurance (it may be cheaper with your insurance), but the website can give you the price if you did not use any insurance.  - You can print the associated coupon and take it with your prescription to the pharmacy.  - You may also stop by our office during regular business hours and pick up a GoodRx coupon card.  - If you need your prescription sent electronically to a different pharmacy, notify our office through [facility] or by phone at [phone] option 4.

## 2021-05-01 NOTE — Progress Notes (Signed)
Follow-Up Visit   Subjective  Holly Le is a 55 y.o. female who presents for the following: Skin Problem (Patient here today for a spot at right upper arm, present for a few months. Patient has been using protopic on it and is on [place] for psoriasis but spot has not changed. Does not itch but is sometimes sore.).  The following portions of the chart were reviewed this encounter and updated as appropriate:   Tobacco  Allergies  Meds  Problems  Med Hx  Surg Hx  Fam Hx      Review of Systems:  No other skin or systemic complaints except as noted in HPI or Assessment and Plan.  Objective  Well appearing patient in no apparent distress; mood and affect are within normal limits.  A focused examination was performed including scalp, right arm, breasts. Relevant physical exam findings are noted in the Assessment and Plan.  bilateral areola Skin clear  Right Upper Arm Lichenified pink papule without features suspicious for malignancy on dermoscopy   face Rhytides and volume loss.                   Assessment & Plan  Pruritus bilateral areola  Recommend OTC [place] Rapid Relief Anti-Itch cream (pramoxine + menthol), CeraVe Anti-itch cream or lotion (pramoxine), Sarna lotion (Original- menthol + camphor or Sensitive- pramoxine) or Eucerin 12 hour Itch Relief lotion (menthol) up to 3 times per day to areas on body that are itchy.  Start protopic twice daily to affected areas as needed for itch.  Psoriasis Scalp  Chronic condition with duration or expected duration over one year. Condition is bothersome to patient. Currently flared.  Denies joint pain.   Psoriasis is a chronic non-curable, but treatable genetic/hereditary disease that may have other systemic features affecting other organ systems such as joints (Psoriatic Arthritis). It is associated with an increased risk of inflammatory bowel disease, heart disease, non-alcoholic fatty liver disease,  and depression.    Restart clobetasol foam twice daily for 2-3 weeks to affected areas at scalp. For area at arm, use once daily and cover. Avoid applying to face, groin, and axilla. Use as directed. Long-term use can cause thinning of the skin.  Topical steroids (such as triamcinolone, fluocinolone, fluocinonide, mometasone, clobetasol, halobetasol, betamethasone, hydrocortisone) can cause thinning and lightening of the skin if they are used for too long in the same area. Your physician has selected the right strength medicine for your problem and area affected on the body. Please use your medication only as directed by your physician to prevent side effects.   Continue protopic twice daily as needed. Has done well with this in past. Could switch to [facility] or Zoryve if not clearing with protopic  Continue [place] Nail If not clearing or if gets repeat flares, may consider Sotyktu  clobetasol (OLUX) 0.05 % topical foam - Scalp Apply topically 2 (two) times daily. For 2-3 weeks. Avoid applying to face, groin, and axilla. Use as directed. Long-term use can cause thinning of the skin.  tacrolimus (PROTOPIC) 0.1 % ointment - Scalp Apply topically 2 (two) times daily.  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 Recommend Vaseline ointment to treated areas while healing.  Call if not clearing  Prior to procedure, discussed  risks of blister formation, small wound, skin dyspigmentation, or rare scar following cryotherapy. Recommend Vaseline ointment to treated areas while healing.    Destruction of lesion - Right Upper Arm  Destruction method: cryotherapy   Informed consent: discussed and consent obtained   Lesion destroyed using liquid nitrogen: Yes    Cryotherapy cycles:  2 Outcome: patient tolerated procedure well with no complications   Post-procedure details: wound care instructions given    Elastosis of skin face  Recommend Restylane Refyne for lips and botox for tiny lines around the lips. Can also do small amount of Refyne in perioral rhytides  Consider Perfect Derma Peel for fines lines and wrinkles, dark spots and skin texture  No hx cold sores. Valtrex 500mg  twice daily for 1 week starting one day prior to procedure.  Recommend consultation with Lind Covert if patient would like Halo laser.   Return for TBSE, as scheduled, 6-8 weeks for follow up. Return for botox as scheduled. Dosing mapped out today.  Graciella Belton, RMA, am acting as scribe for Forest Gleason, MD .  Documentation: I have reviewed the above documentation for accuracy and completeness, and I agree with the above.  Forest Gleason, MD

## 2021-05-03 ENCOUNTER — Encounter: Payer: Self-pay | Admitting: Dermatology

## 2021-05-15 ENCOUNTER — Ambulatory Visit: Admission: RE | Admit: 2021-05-15 | Payer: BC Managed Care – PPO | Source: Ambulatory Visit

## 2021-05-15 ENCOUNTER — Ambulatory Visit
Admission: RE | Admit: 2021-05-15 | Discharge: 2021-05-15 | Disposition: A | Discharge status: Home / Self Care | Payer: BC Managed Care – PPO | Source: Ambulatory Visit | Attending: Nurse Practitioner | Admitting: Nurse Practitioner

## 2021-05-15 ENCOUNTER — Other Ambulatory Visit: Payer: Self-pay

## 2021-05-15 DIAGNOSIS — M85851 Other specified disorders of bone density and structure, right thigh: Secondary | ICD-10-CM | POA: Diagnosis not present

## 2021-05-15 DIAGNOSIS — N6459 Other signs and symptoms in breast: Secondary | ICD-10-CM | POA: Diagnosis not present

## 2021-05-15 DIAGNOSIS — Z780 Asymptomatic menopausal state: Secondary | ICD-10-CM | POA: Diagnosis not present

## 2021-05-15 DIAGNOSIS — R922 Inconclusive mammogram: Secondary | ICD-10-CM | POA: Diagnosis not present

## 2021-05-15 DIAGNOSIS — N649 Disorder of breast, unspecified: Secondary | ICD-10-CM | POA: Insufficient documentation

## 2021-05-15 DIAGNOSIS — Z13820 Encounter for screening for osteoporosis: Secondary | ICD-10-CM | POA: Diagnosis not present

## 2021-05-23 ENCOUNTER — Other Ambulatory Visit: Payer: Self-pay

## 2021-05-23 ENCOUNTER — Encounter: Payer: Self-pay | Admitting: Dermatology

## 2021-05-23 ENCOUNTER — Ambulatory Visit (INDEPENDENT_AMBULATORY_CARE_PROVIDER_SITE_OTHER): Payer: Self-pay | Admitting: Dermatology

## 2021-05-23 ENCOUNTER — Telehealth: Payer: Self-pay

## 2021-05-23 DIAGNOSIS — L988 Other specified disorders of the skin and subcutaneous tissue: Secondary | ICD-10-CM

## 2021-05-23 DIAGNOSIS — L409 Psoriasis, unspecified: Secondary | ICD-10-CM

## 2021-05-23 MED ORDER — HYDROXYZINE HCL 25 MG PO TABS
25.0000 mg | ORAL_TABLET | ORAL | 0 refills | Status: AC
Start: 2021-05-23 — End: ?

## 2021-05-23 MED ORDER — CLOBETASOL PROPIONATE 0.05 % EX SOLN: 1.0000 "application " | CUTANEOUS | 2 refills | Status: DC

## 2021-05-23 NOTE — Telephone Encounter (Signed)
Patient reports she has been able to get rx filled for protopic and clobetasol foam that was prescribed at her last appointment to use at her scalp for psoriasis.   Referred to patient message dated 05/01/21. Patient had been informed by Estill Bamberg that protopic was approved and patient instructed to reach out to pharmacy to rerun prescription.  Patient was also instructed she could get clobetasol foam for $50 dollars.   Provider would like to send in clobetasol solution at this time instead. Patient instructed to use clobetasol solution twice daily at scalp as needed at scalp for psoriasis until relieved. Patient also given prescription for hydroxazine 25 mg take 1 - 2 times daily as needed to help with itching.   Prescriptions sent to patient preferred pharmacy

## 2021-05-23 NOTE — Progress Notes (Signed)
Follow-Up Visit   Subjective  Holly Le is a 55 y.o. female who presents for the following: Follow-up (Patient here today for botox follow up. She is also interested in possible filler to help with lines around lips and fullness of lips. ).  The following portions of the chart were reviewed this encounter and updated as appropriate:  Tobacco  Allergies  Meds  Problems  Med Hx  Surg Hx  Fam Hx      Review of Systems: No other skin or systemic complaints except as noted in HPI or Assessment and Plan.   Objective  Well appearing patient in no apparent distress; mood and affect are within normal limits.  A focused examination was performed including face, lips. Relevant physical exam findings are noted in the Assessment and Plan.  Head - Anterior (Face) Rhytides and volume loss.                  Assessment & Plan  Elastosis of skin Head - Anterior (Face)  Patient is concerned about primary lines around mouth and thinning of lips   Restyln refyne $650  Botox 6 units to upper lip 2 units to lower lip 2 units for gummy smile as marked Upper face botox as previous   Patient advised swelling of lips will be most prominent today and tomorrow then start to go down.      Filling material injection - Head - Anterior (Face) Location: Forehead, glabella , crows feet, lips  Informed consent: Discussed risks (infection, pain, bleeding, bruising, swelling, allergic reaction, paralysis of nearby muscles, eyelid droop, double vision, neck weakness, difficulty breathing, headache, undesirable cosmetic result, and need for additional treatment) and benefits of the procedure, as well as the alternatives.  Informed consent was obtained.  Preparation: The area was cleansed with alcohol.  Procedure Details:  Botox was injected into the dermis with a 30-gauge needle. Pressure applied to any bleeding. Ice packs offered for swelling.  Lot Number:   [id number] Expiration:  08/2023  Total Units Injected:  58  Plan: Tylenol may be used for headache.  Allow 2 weeks before returning to clinic for additional dosing as needed. Patient will call for any problems.   Filling material injection - Head - Anterior (Face) Prior to the procedure, the patient's past medical history, allergies and the rare but potential risks and complications were reviewed with the patient and a signed consent was obtained. Pre and post-treatment care was discussed and instructions provided.  Location: lips, rhytid at right cheek, perioral  Filler Type: Restylane refyne  Procedure: Lidocaine-tetracaine ointment was applied to treatment areas to achieve good local anesthesia. The area was prepped thoroughly with Puracyn. After introducing the needle into the desired treatment area, the syringe plunger was drawn back to ensure there was no flash of blood prior to injecting the filler in order to minimize risk of intravascular injection and vascular occlusion.    Patient tolerated the procedure well. She did get a larger bruise/swelling at the right upper lip causing some appearance of asymmetry as well as overall swelling of the upper lip more than the lower lip - reassured patient that swelling will go down after the first few days but may be worse tomorrow. The patient will call with any problems, questions or concerns prior to their next appointment.  Lot [id number] Exp 08/21/2022 NDC   Return for recheck filler in 1 week, 3 - 4 month follow up botox .  Garry Heater, CMA, am  acting as scribe for Forest Gleason, MD.  Documentation: I have reviewed the above documentation for accuracy and completeness, and I agree with the above.  Forest Gleason, MD

## 2021-05-23 NOTE — Patient Instructions (Addendum)

## 2021-06-06 ENCOUNTER — Encounter: Payer: Self-pay | Admitting: Dermatology

## 2021-06-06 ENCOUNTER — Ambulatory Visit: Payer: BC Managed Care – PPO | Admitting: Dermatology

## 2021-06-06 ENCOUNTER — Other Ambulatory Visit: Payer: Self-pay

## 2021-06-06 DIAGNOSIS — L309 Dermatitis, unspecified: Secondary | ICD-10-CM

## 2021-06-06 DIAGNOSIS — L988 Other specified disorders of the skin and subcutaneous tissue: Secondary | ICD-10-CM

## 2021-06-06 DIAGNOSIS — L280 Lichen simplex chronicus: Secondary | ICD-10-CM | POA: Diagnosis not present

## 2021-06-06 DIAGNOSIS — L409 Psoriasis, unspecified: Secondary | ICD-10-CM

## 2021-06-06 MED ORDER — TACROLIMUS 0.1 % EX OINT: TOPICAL_OINTMENT | Freq: Two times a day (BID) | CUTANEOUS | 2 refills | Status: AC

## 2021-06-06 MED ORDER — CLOBETASOL PROPIONATE 0.05 % EX SOLN: 1.0000 "application " | CUTANEOUS | 2 refills | Status: DC

## 2021-06-06 NOTE — Progress Notes (Addendum)
Follow-Up Visit   Subjective  Holly Le is a 55 y.o. female who presents for the following: Follow-up (Patient here today for follow up on psoriasis. Reports she is still itchy at scalp and areolas. Patient was very pleased with filler results. ).  Use at clobetasol at arm  3-4 weeks  The following portions of the chart were reviewed this encounter and updated as appropriate:  Tobacco   Allergies   Meds   Problems   Med Hx   Surg Hx   Fam Hx       Review of Systems: No other skin or systemic complaints except as noted in HPI or Assessment and Plan.   Objective  Well appearing patient in no apparent distress; mood and affect are within normal limits.  A focused examination was performed including face, scalp, right arm, . Relevant physical exam findings are noted in the Assessment and Plan.  Scalp Thicker scaly pink plaques at scalp   Right Breast Xerotic scaly thin plaques at breast  Right Upper Arm - Anterior Lichenified small plaque  Assessment & Plan  Psoriasis Scalp  Chronic condition with duration over one year. Condition is bothersome to patient. Currently flared.  Psoriasis is a chronic non-curable, but treatable genetic/hereditary disease that may have other systemic features affecting other organ systems such as joints (Psoriatic Arthritis). It is associated with an increased risk of inflammatory bowel disease, heart disease, non-alcoholic fatty liver disease, and depression.    Clobetasol 0.05 % solution - apply topically 2 times daily for scalp psoriasis   Vtama sample given to use at scalp daily  [id number]  Nov 2023  Recommend Tsal - once weekly leave at scalp for 10 minutes   Continue otezla BID  May consider skyrizi in future; Pt had reaction to cosentyx; pt failed xtrac  Topical steroids (such as triamcinolone, fluocinolone, fluocinonide, mometasone, clobetasol, halobetasol, betamethasone, hydrocortisone) can cause thinning and lightening of the skin  if they are used for too long in the same area. Your physician has selected the right strength medicine for your problem and area affected on the body. Please use your medication only as directed by your physician to prevent side effects.    Related Medications Apremilast (OTEZLA) 30 MG TABS TAKE 1 TABLET BY MOUTH TWICE DAILY AS DIRECTED  hydrOXYzine (ATARAX) 25 MG tablet Take 1 tablet (25 mg total) by mouth See admin instructions. 1 - 2 times daily as needed for itching. May cause drowsiness  tacrolimus (PROTOPIC) 0.1 % ointment Apply topically 2 (two) times daily.  clobetasol (TEMOVATE) 0.05 % external solution Apply 1 application topically See admin instructions. Apply topically 2 (two) times daily to scalp for psoriasis. Avoid applying to face, groin, and axilla. Use as directed. Long-term use can cause thinning of the skin.  Elastosis of skin Mid Upper [place]ilion Lip  Patient pleased with results from recent lip filler and botox. Plan botox in 3 months   Eczema, unspecified type Right Breast  Start opzelura - apply to affected area daily   Lot [id number] Exp April 2024 Ndc [id number]  Use Clobetasol  1 - 2 times daily for up to 2 weeks.  Topical steroids (such as triamcinolone, fluocinolone, fluocinonide, mometasone, clobetasol, halobetasol, betamethasone, hydrocortisone) can cause thinning and lightening of the skin if they are used for too long in the same area. Your physician has selected the right strength medicine for your problem and area affected on the body. Please use your medication only as directed by your  physician to prevent side effects.   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 Reviewed risk of atrophy, lightening     Return for 4- 6 week follow up psoriasis and eczema  , .  I, Ruthell Rummage, CMA, am acting as scribe for Forest Gleason, MD.  Documentation: I have reviewed the above documentation for accuracy and completeness, and I agree with the above.  Forest Gleason, MD

## 2021-06-06 NOTE — Patient Instructions (Addendum)
Clobetasol solution - can use at arm daily for 3 - 4 weeks  Clobetasol solution - can be used at breast as needed for itch  Vtama use once daily for itchy scalp  Opzelura - use once daily for itchy area at breasts   Recommend tsal shampoo to use once weekly at scalp apply and leave for 10 minutes.  to help with scale      Topical steroids (such as triamcinolone, fluocinolone, fluocinonide, mometasone, clobetasol, halobetasol, betamethasone, hydrocortisone) can cause thinning and lightening of the skin if they are used for too long in the same area. Your physician has selected the right strength medicine for your problem and area affected on the body. Please use your medication only as directed by your physician to prevent side effects.    If You Need Anything After Your Visit  If you have any questions or concerns for your doctor, please call our main line at [phone] and press option 4 to reach your doctor's medical assistant. If no one answers, please leave a voicemail as directed and we will return your call as soon as possible. Messages left after 4 pm will be answered the following business day.   You may also send [place] a message via [place]. We typically respond to MyChart messages within 1-2 business days.  For prescription refills, please ask your pharmacy to contact our office. Our fax number is [phone].  If you have an urgent issue when the clinic is closed that cannot wait until the next business day, you can page your doctor at the number below.    Please note that while we do our best to be available for urgent issues outside of office hours, we are not available 24/7.   If you have an urgent issue and are unable to reach [place], you may choose to seek medical care at your doctor's office, retail clinic, urgent care center, or emergency room.  If you have a medical emergency, please immediately call 911 or go to the emergency department.  Pager Numbers  - Dr.  Nehemiah Massed: [phone]  - Dr. Laurence Ferrari: [phone]  - Dr. Nicole Kindred: [phone]  In the event of inclement weather, please call our main line at [phone] for an update on the status of any delays or closures.  Dermatology Medication Tips: Please keep the boxes that topical medications come in in order to help keep track of the instructions about where and how to use these. Pharmacies typically print the medication instructions only on the boxes and not directly on the medication tubes.   If your medication is too expensive, please contact our office at [phone] option 4 or send [place] a message through [place].   We are unable to tell what your co-pay for medications will be in advance as this is different depending on your insurance coverage. However, we may be able to find a substitute medication at lower cost or fill out paperwork to get insurance to cover a needed medication.   If a prior authorization is required to get your medication covered by your insurance company, please allow [place] 1-2 business days to complete this process.  Drug prices often vary depending on where the prescription is filled and some pharmacies may offer cheaper prices.  The website www.goodrx.com contains coupons for medications through different pharmacies. The prices here do not account for what the cost may be with help from insurance (it may be cheaper with your insurance), but the website can give you the price if you  did not use any insurance.  - You can print the associated coupon and take it with your prescription to the pharmacy.  - You may also stop by our office during regular business hours and pick up a GoodRx coupon card.  - If you need your prescription sent electronically to a different pharmacy, notify our office through [facility] or by phone at [phone] option 4.     Si Usted Necesita Algo Despus de Su Visita  Tambin puede enviarnos un mensaje a travs de Pharmacist, community. Por lo  general respondemos a los mensajes de MyChart en el transcurso de 1 a 2 das hbiles.  Para renovar recetas, por favor pida a su farmacia que se ponga en contacto con nuestra oficina. Harland Dingwall de fax es [place] [phone].  Si tiene un asunto urgente cuando la clnica est cerrada y que no puede esperar hasta el siguiente da hbil, puede llamar/localizar a su doctor(a) al nmero que aparece a continuacin.   Por favor, tenga en cuenta que aunque hacemos todo lo posible para estar disponibles para asuntos urgentes fuera del horario de [place], no estamos disponibles las 24 horas del da, los 7 das de la [place].   Si tiene un problema urgente y no puede comunicarse con nosotros, puede optar por buscar atencin mdica  en el consultorio de su doctor(a), en una clnica privada, en un centro de atencin urgente o en una sala de emergencias.  Si tiene Engineering geologist, por favor llame inmediatamente al 911 o vaya a la sala de emergencias.  Nmeros de bper  - Dr. Nehemiah Massed: [phone]  - Dra. Moye: [phone]  - Dra. Nicole Kindred: [phone]  En caso de inclemencias del [place], por favor llame a Johnsie Kindred principal al [phone] para una actualizacin sobre el [place] de cualquier retraso o cierre.  Consejos para la medicacin en dermatologa: Por favor, guarde las cajas en las que vienen los medicamentos de uso tpico para ayudarle a seguir las instrucciones sobre dnde y cmo usarlos. Las farmacias generalmente imprimen las instrucciones del medicamento slo en las cajas y no directamente en los tubos del [place].   Si su medicamento es muy caro, por favor, pngase en contacto con Zigmund Daniel llamando al [phone] y presione la opcin 4 o envenos un mensaje a travs de Pharmacist, community.   No podemos decirle cul ser su copago por los medicamentos por adelantado ya que esto es diferente dependiendo de la cobertura de su seguro. Sin embargo, es posible que podamos encontrar un  medicamento sustituto a Electrical engineer un formulario para que el seguro cubra el medicamento que se considera necesario.   Si se requiere una autorizacin previa para que su compaa de seguros [place] su medicamento, por favor permtanos de 1 a 2 das hbiles para completar este proceso.  Los precios de los medicamentos varan con frecuencia dependiendo del Environmental consultant de dnde se surte la receta y alguna farmacias pueden ofrecer precios ms baratos.  El sitio web www.goodrx.com tiene cupones para medicamentos de Airline pilot. Los precios aqu no tienen en cuenta lo que podra costar con la ayuda del seguro (puede ser ms barato con su seguro), pero el sitio web puede darle el precio si no utiliz Research scientist (physical sciences).  - Puede imprimir el cupn correspondiente y llevarlo con su receta a la farmacia.  - Tambin puede pasar por nuestra oficina durante el horario de atencin regular y Charity fundraiser una tarjeta de cupones de GoodRx.  - Si necesita que su receta se  enve electrnicamente a una farmacia diferente, informe a nuestra oficina a travs de MyChart de [place] o por telfono llamando al [phone] y presione la opcin 4.

## 2021-06-11 ENCOUNTER — Ambulatory Visit: Payer: BC Managed Care – PPO | Admitting: Dermatology

## 2021-06-28 ENCOUNTER — Other Ambulatory Visit: Payer: Self-pay | Admitting: Nurse Practitioner

## 2021-06-28 ENCOUNTER — Encounter: Payer: Self-pay | Admitting: Dermatology

## 2021-06-28 DIAGNOSIS — F411 Generalized anxiety disorder: Secondary | ICD-10-CM

## 2021-06-29 ENCOUNTER — Other Ambulatory Visit: Payer: Self-pay

## 2021-06-29 ENCOUNTER — Ambulatory Visit: Payer: BC Managed Care – PPO | Admitting: Nurse Practitioner

## 2021-06-29 ENCOUNTER — Encounter: Payer: Self-pay | Admitting: Nurse Practitioner

## 2021-06-29 VITALS — BP 106/68 | HR 60 | Temp 98.3°F | Resp 16 | Ht 64.0 in | Wt 149.8 lb

## 2021-06-29 DIAGNOSIS — E538 Deficiency of other specified B group vitamins: Secondary | ICD-10-CM

## 2021-06-29 DIAGNOSIS — E785 Hyperlipidemia, unspecified: Secondary | ICD-10-CM | POA: Diagnosis not present

## 2021-06-29 DIAGNOSIS — E559 Vitamin D deficiency, unspecified: Secondary | ICD-10-CM

## 2021-06-29 DIAGNOSIS — Z23 Encounter for immunization: Secondary | ICD-10-CM

## 2021-06-29 DIAGNOSIS — F411 Generalized anxiety disorder: Secondary | ICD-10-CM

## 2021-06-29 DIAGNOSIS — F5101 Primary insomnia: Secondary | ICD-10-CM

## 2021-06-29 MED ORDER — ERGOCALCIFEROL 1.25 MG (50000 UT) PO CAPS: ORAL_CAPSULE | ORAL | 1 refills | Status: AC

## 2021-06-29 MED ORDER — PNEUMOCOCCAL 20-VAL CONJ VACC 0.5 ML IM SUSY: 0.5000 mL | PREFILLED_SYRINGE | INTRAMUSCULAR | 0 refills | Status: AC

## 2021-06-29 MED ORDER — "INSULIN SYRINGE/NEEDLE 28G X 1/2"" 1 ML MISC": 3 refills | Status: AC

## 2021-06-29 MED ORDER — CITALOPRAM HYDROBROMIDE 10 MG PO TABS: 10.0000 mg | ORAL_TABLET | Freq: Every day | ORAL | 2 refills | Status: DC

## 2021-06-29 MED ORDER — CYANOCOBALAMIN 1000 MCG/ML IJ SOLN: INTRAMUSCULAR | 3 refills | Status: AC

## 2021-06-29 MED ORDER — TETANUS-DIPHTH-ACELL PERTUSSIS 5-2.5-18.5 LF-MCG/0.5 IM SUSP: 0.5000 mL | Freq: Once | INTRAMUSCULAR | 0 refills | Status: AC

## 2021-06-29 MED ORDER — ZOLPIDEM TARTRATE 10 MG PO TABS: 10.0000 mg | ORAL_TABLET | Freq: Every evening | ORAL | 2 refills | Status: AC | PRN

## 2021-06-29 MED ORDER — CITALOPRAM HYDROBROMIDE 20 MG PO TABS: 20.0000 mg | ORAL_TABLET | Freq: Every day | ORAL | 2 refills | Status: AC

## 2021-06-29 MED ORDER — ZOSTER VAC RECOMB ADJUVANTED 50 MCG/0.5ML IM SUSR: 0.5000 mL | Freq: Once | INTRAMUSCULAR | 0 refills | Status: AC

## 2021-06-29 NOTE — Progress Notes (Signed)
[facility] [place], [place] [address]  Internal MEDICINE  Office Visit Note  Patient Name: Holly Le  [id number]  [id number]  Date of Service: 06/29/2021  Chief Complaint  Patient presents with   Follow-up   Headache   Fatigue    HPI Holly Le presents for a follow up visit for headache and fatigue and medication refills. She has an upcoming physical and would like to get her labs drawn prior to her office visit. She has chronic vitamin D deficiency and chronic B12 deficiency. Ordering labs could also help determine what is causing her fatigue. For headaches, they are manageable with OTC medication.     Current Medication: Outpatient Encounter Medications as of 06/29/2021  Medication Sig   ALPRAZolam (XANAX) 0.5 MG tablet Take 1 tablet (0.5 mg total) by mouth daily as needed.   Apremilast (OTEZLA) 30 MG TABS TAKE 1 TABLET BY MOUTH TWICE DAILY AS DIRECTED   clobetasol (TEMOVATE) 0.05 % external solution Apply 1 application topically See admin instructions. Apply topically 2 (two) times daily to scalp for psoriasis. Avoid applying to face, groin, and axilla. Use as directed. Long-term use can cause thinning of the skin.   hydrOXYzine (ATARAX) 25 MG tablet Take 1 tablet (25 mg total) by mouth See admin instructions. 1 - 2 times daily as needed for itching. May cause drowsiness   tacrolimus (PROTOPIC) 0.1 % ointment Apply topically 2 (two) times daily.   [DISCONTINUED] BELSOMRA 15 MG TABS TAKE 1 TABLET BY MOUTH AT BEDTIME AS NEEDED FOR INSOMNIA   [DISCONTINUED] citalopram (CELEXA) 10 MG tablet Take 1 tablet (10 mg total) by mouth daily.   [DISCONTINUED] citalopram (CELEXA) 20 MG tablet Take 1 tablet (20 mg total) by mouth daily.   [DISCONTINUED] cyanocobalamin (,VITAMIN B-12,) 1000 MCG/ML injection Inject 49m IM once weekly for 4 weeks then once monthly after that.   [DISCONTINUED] ergocalciferol (DRISDOL) 1.25 MG (50000 UT) capsule Take one cap q week    [DISCONTINUED] INS SYRINGE/NEEDLE 1CC/28G (GNP INSULIN SYRINGES 28GX1/2") 28G X 1/2" 1 ML MISC Use for vitamin B12 injections. once weekly for 4 weeks then once monthly.   [DISCONTINUED] pneumococcal 20-valent conjugate vaccine (PREVNAR 20) 0.5 ML injection Inject 0.5 mLs into the muscle tomorrow at 10 am.   [DISCONTINUED] Tdap (BOOSTRIX) 5-2.5-18.5 LF-MCG/0.5 injection Inject 0.5 mLs into the muscle once.   [DISCONTINUED] zolpidem (AMBIEN) 10 MG tablet TAKE ONE TABLET AT BEDTIME AS NEEDED FORSLEEP   [DISCONTINUED] Zoster Vaccine Adjuvanted ([facility] injection Inject 0.5 mLs into the muscle once.   citalopram (CELEXA) 10 MG tablet Take 1 tablet (10 mg total) by mouth daily.   citalopram (CELEXA) 20 MG tablet Take 1 tablet (20 mg total) by mouth daily.   cyanocobalamin (,VITAMIN B-12,) 1000 MCG/ML injection Inject 116mIM once weekly for 4 weeks then once monthly after that.   ergocalciferol (DRISDOL) 1.25 MG (50000 UT) capsule Take one cap q week   INS SYRINGE/NEEDLE 1CC/28G (GNP INSULIN SYRINGES 28GX1/2") 28G X 1/2" 1 ML MISC Use for vitamin B12 injections. once weekly for 4 weeks then once monthly.   [EXPIRED] pneumococcal 20-valent conjugate vaccine (PREVNAR 20) 0.5 ML injection Inject 0.5 mLs into the muscle tomorrow at 10 am for 1 dose.   [EXPIRED] Tdap (BOOSTRIX) 5-2.5-18.5 LF-MCG/0.5 injection Inject 0.5 mLs into the muscle once for 1 dose.   zolpidem (AMBIEN) 10 MG tablet Take 1 tablet (10 mg total) by mouth at bedtime as needed for sleep.   [EXPIRED] Zoster Vaccine Adjuvanted (  SHINGRIX) injection Inject 0.5 mLs into the muscle once for 1 dose.   [DISCONTINUED] buPROPion (WELLBUTRIN XL) 150 MG 24 hr tablet Take 1 tablet (150 mg total) by mouth daily.   [DISCONTINUED] omeprazole (PRILOSEC) 20 MG capsule Take 1 capsule (20 mg total) by mouth daily.   [DISCONTINUED] tacrolimus (PROTOPIC) 0.1 % ointment Apply topically 2 (two) times daily. (Patient not taking: Reported on 06/29/2021)   No  facility-administered encounter medications on file as of 06/29/2021.    Surgical History: Past Surgical History:  Procedure Laterality Date   back injection     CESAREAN SECTION     twins   FOOT SURGERY     LITHOTRIPSY     SHOULDER SURGERY  01/2009   RIGHT    Medical History: Past Medical History:  Diagnosis Date   Anxiety    Depression    Dysplastic nevus 10/14/2008   Lower back. Moderate atypia, extends to one edge.   Dysplastic nevus 10/07/2013   Right lower back. Moderate atypia, limited margins free.   Dysplastic nevus 08/23/2020   Right lower back, superior. Moderate atypia, limited margins free.   Grave's disease     Family History: Family History  Problem Relation Age of Onset   Lymphoma Father    Lymphoma Paternal Aunt    Cancer Maternal Grandfather        COLON   Breast cancer Paternal Aunt     Social History   Socioeconomic History   Marital status: Married    Spouse name: Not on file   Number of children: Not on file   Years of education: Not on file   Highest education level: Not on file  Occupational History   Not on file  Tobacco Use   Smoking status: Former    Types: E-cigarettes    Quit date: 08/25/2020    Years since quitting: 0.9   Smokeless tobacco: Never   Tobacco comments:    5 a days   Vaping Use   Vaping Use: Every day   Devices: juul  Substance and Sexual Activity   Alcohol use: Yes    Comment: occasionally   Drug use: No   Sexual activity: Not on file  Other Topics Concern   Not on file  Social History Narrative   ** Merged History Encounter **       Social Determinants of Health   Financial Resource Strain: Not on file  Food Insecurity: Not on file  Transportation Needs: Not on file  Physical Activity: Not on file  Stress: Not on file  Social Connections: Not on file  Intimate Partner Violence: Not on file      Review of Systems  Constitutional:  Positive for fatigue. Negative for chills and unexpected weight  change.  HENT:  Negative for congestion, postnasal drip, rhinorrhea, sneezing and sore throat.   Eyes:  Negative for redness.  Respiratory:  Negative for cough, chest tightness and shortness of breath.   Cardiovascular:  Negative for chest pain and palpitations.  Gastrointestinal:  Negative for abdominal pain, constipation, diarrhea, nausea and vomiting.  Genitourinary:  Negative for dysuria and frequency.  Musculoskeletal:  Negative for arthralgias, back pain, joint swelling and neck pain.  Skin:  Negative for rash.  Neurological:  Positive for headaches. Negative for tremors and numbness.  Hematological:  Negative for adenopathy. Does not bruise/bleed easily.  Psychiatric/Behavioral:  Negative for behavioral problems (Depression), sleep disturbance and suicidal ideas. The patient is not nervous/anxious.    Vital Signs: BP  106/68    Pulse 60    Temp 98.3 F (36.8 C)    Resp 16    Ht '5\' 4"'  (1.626 m)    Wt 149 lb 12.8 oz (67.9 kg)    LMP 02/19/2017    SpO2 98%    BMI 25.71 kg/m    Physical Exam Vitals reviewed.  Constitutional:      General: She is not in acute distress.    Appearance: Normal appearance. She is normal weight. She is not ill-appearing.  HENT:     Head: Normocephalic and atraumatic.  Eyes:     Pupils: Pupils are equal, round, and reactive to light.  Cardiovascular:     Rate and Rhythm: Normal rate and regular rhythm.  Pulmonary:     Effort: Pulmonary effort is normal. No respiratory distress.  Neurological:     Mental Status: She is alert and oriented to person, place, and time.     Cranial Nerves: No cranial nerve deficit.     Coordination: Coordination normal.     Gait: Gait normal.  Psychiatric:        Mood and Affect: Mood normal.        Behavior: Behavior normal.       Assessment/Plan: 1. Vitamin B12 deficiency B12 and syringes ordered, labs ordered.  - cyanocobalamin (,VITAMIN B-12,) 1000 MCG/ML injection; Inject 51m IM once weekly for 4 weeks then  once monthly after that.  Dispense: 10 mL; Refill: 3 - INS SYRINGE/NEEDLE 1CC/28G (GNP INSULIN SYRINGES 28GX1/2") 28G X 1/2" 1 ML MISC; Use for vitamin B12 injections. once weekly for 4 weeks then once monthly.  Dispense: 10 each; Refill: 3 - B12 and Folate Panel - TSH + free T4 - CMP14+EGFR - CBC with Differential/Platelet  2. Vitamin D deficiency Refill ordered, routine labs ordered  - ergocalciferol (DRISDOL) 1.25 MG (50000 UT) capsule; Take one cap q week  Dispense: 12 capsule; Refill: 1 - Vitamin D (25 hydroxy) - TSH + free T4 - CMP14+EGFR - CBC with Differential/Platelet  3. Hyperlipidemia, unspecified hyperlipidemia type Routine labs ordered.  - Lipid Profile - TSH + free T4 - CMP14+EGFR - CBC with Differential/Platelet  4. Need for vaccination - Zoster Vaccine Adjuvanted ([facility] injection; Inject 0.5 mLs into the muscle once for 1 dose.  Dispense: 0.5 mL; Refill: 0 - Tdap (BOOSTRIX) 5-2.5-18.5 LF-MCG/0.5 injection; Inject 0.5 mLs into the muscle once for 1 dose.  Dispense: 0.5 mL; Refill: 0 - pneumococcal 20-valent conjugate vaccine (PREVNAR 20) 0.5 ML injection; Inject 0.5 mLs into the muscle tomorrow at 10 am for 1 dose.  Dispense: 0.5 mL; Refill: 0  5. Primary insomnia Ambien is effective for sleep, labs ordered. Refills ordered.  - zolpidem (AMBIEN) 10 MG tablet; Take 1 tablet (10 mg total) by mouth at bedtime as needed for sleep.  Dispense: 30 tablet; Refill: 2 - CMP14+EGFR - CBC with Differential/Platelet  6. Generalized anxiety disorder Refills ordered, currently stable.  - citalopram (CELEXA) 10 MG tablet; Take 1 tablet (10 mg total) by mouth daily.  Dispense: 30 tablet; Refill: 2 - citalopram (CELEXA) 20 MG tablet; Take 1 tablet (20 mg total) by mouth daily.  Dispense: 30 tablet; Refill: 2   General Counseling: Holly Le verbalizes understanding of the findings of todays visit and agrees with plan of treatment. I have discussed any further diagnostic evaluation  that may be needed or ordered today. We also reviewed her medications today. she has been encouraged to call the office with any questions or concerns  that should arise related to todays visit.    Orders Placed This Encounter  Procedures   B12 and Folate Panel   Vitamin D (25 hydroxy)   Lipid Profile   TSH + free T4   CMP14+EGFR   CBC with Differential/Platelet    Meds ordered this encounter  Medications   Zoster Vaccine Adjuvanted [facility]) injection    Sig: Inject 0.5 mLs into the muscle once for 1 dose.    Dispense:  0.5 mL    Refill:  0   Tdap (BOOSTRIX) 5-2.5-18.5 LF-MCG/0.5 injection    Sig: Inject 0.5 mLs into the muscle once for 1 dose.    Dispense:  0.5 mL    Refill:  0   pneumococcal 20-valent conjugate vaccine (PREVNAR 20) 0.5 ML injection    Sig: Inject 0.5 mLs into the muscle tomorrow at 10 am for 1 dose.    Dispense:  0.5 mL    Refill:  0   zolpidem (AMBIEN) 10 MG tablet    Sig: Take 1 tablet (10 mg total) by mouth at bedtime as needed for sleep.    Dispense:  30 tablet    Refill:  2   citalopram (CELEXA) 10 MG tablet    Sig: Take 1 tablet (10 mg total) by mouth daily.    Dispense:  30 tablet    Refill:  2    To be taken with the 20 mg tablet for a daily dose of 30 mg   citalopram (CELEXA) 20 MG tablet    Sig: Take 1 tablet (20 mg total) by mouth daily.    Dispense:  30 tablet    Refill:  2    To be taken with the 10 mg tablet for a daily dose of 30 mg   cyanocobalamin (,VITAMIN B-12,) 1000 MCG/ML injection    Sig: Inject 51m IM once weekly for 4 weeks then once monthly after that.    Dispense:  10 mL    Refill:  3   INS SYRINGE/NEEDLE 1CC/28G (GNP INSULIN SYRINGES 28GX1/2") 28G X 1/2" 1 ML MISC    Sig: Use for vitamin B12 injections. once weekly for 4 weeks then once monthly.    Dispense:  10 each    Refill:  3   ergocalciferol (DRISDOL) 1.25 MG (50000 UT) capsule    Sig: Take one cap q week    Dispense:  12 capsule    Refill:  1    Return for  previously scheduled, CPE, Lesley Atkin PCP in april.   Total time spent:30 Minutes Time spent includes review of chart, medications, test results, and follow up plan with the patient.   [place] Controlled Substance Database was reviewed by me.  This patient was seen by AJonetta Osgood FNP-C in collaboration with Dr. FClayborn Bignessas a part of collaborative care agreement.   Merica Prell R. AValetta Fuller MSN, FNP-C Internal medicine

## 2021-07-10 ENCOUNTER — Ambulatory Visit: Payer: BC Managed Care – PPO | Admitting: Dermatology

## 2021-07-10 ENCOUNTER — Other Ambulatory Visit: Payer: Self-pay

## 2021-07-10 DIAGNOSIS — L409 Psoriasis, unspecified: Secondary | ICD-10-CM

## 2021-07-10 NOTE — Patient Instructions (Signed)

## 2021-07-10 NOTE — Progress Notes (Signed)
° °  Follow-Up Visit   Subjective  Holly Le is a 56 y.o. female who presents for the following: Psoriasis (Pt reports no improvement in the scalp. No other active areas. Pt treating with Otezla 30 mg BID, clobetasol solution, vtama, and tsal shampoo. Pt has failed cosentyx and xtrac in the past. Pt c/o joint pain in fingers and knees in the mornings and at night. States that she has stiffness in the mornings for approx 15 mins. ).  The following portions of the chart were reviewed this encounter and updated as appropriate:  Tobacco   Allergies   Meds   Problems   Med Hx   Surg Hx   Fam Hx       Review of Systems: No other skin or systemic complaints except as noted in HPI or Assessment and Plan.   Objective  Well appearing patient in no apparent distress; mood and affect are within normal limits.  A focused examination was performed including scalp. Relevant physical exam findings are noted in the Assessment and Plan.  Scalp Red scaly plaques, widespread on scalp   Assessment & Plan  Psoriasis Scalp  Chronic condition with duration or expected duration over one year. Condition is bothersome to patient. Currently flared at scalp despite Otezla, clobetasol, Vtama.  Psoriasis is a chronic non-curable, but treatable genetic/hereditary disease that may have other systemic features affecting other organ systems such as joints (Psoriatic Arthritis). It is associated with an increased risk of inflammatory bowel disease, heart disease, non-alcoholic fatty liver disease, and depression.    Recommend seeing Rheumatology for joint pain and stiffness. Will refer to [facility].   Discussed Skyrizi. Pending negative quantiferon gold, will submit to insurance. Will start pending insurance approval and recommendation from Rheumatology.  Continue Otezla BID. Continue Clobetasol solution to scalp.  Continue Vtama.   Zoryve samples given today. Will prescribe if helpful. Apply  qd to scalp.   Related Procedures Ambulatory referral to Rheumatology QuantiFERON-TB Gold Plus  Related Medications Apremilast (OTEZLA) 30 MG TABS TAKE 1 TABLET BY MOUTH TWICE DAILY AS DIRECTED  hydrOXYzine (ATARAX) 25 MG tablet Take 1 tablet (25 mg total) by mouth See admin instructions. 1 - 2 times daily as needed for itching. May cause drowsiness  tacrolimus (PROTOPIC) 0.1 % ointment Apply topically 2 (two) times daily.  clobetasol (TEMOVATE) 0.05 % external solution Apply 1 application topically See admin instructions. Apply topically 2 (two) times daily to scalp for psoriasis. Avoid applying to face, groin, and axilla. Use as directed. Long-term use can cause thinning of the skin.   Return in about 2 months (around 09/07/2021) for psoriasis f/u, early-mid March botox, early July for filler.  I, Harriett Sine, CMA, am acting as scribe for Forest Gleason, MD.  Documentation: I have reviewed the above documentation for accuracy and completeness, and I agree with the above.  Forest Gleason, MD

## 2021-07-11 ENCOUNTER — Encounter: Payer: Self-pay | Admitting: Dermatology

## 2021-08-04 ENCOUNTER — Encounter: Payer: Self-pay | Admitting: Nurse Practitioner

## 2021-08-08 DIAGNOSIS — M5116 Intervertebral disc disorders with radiculopathy, lumbar region: Secondary | ICD-10-CM | POA: Diagnosis not present

## 2021-08-08 DIAGNOSIS — M2550 Pain in unspecified joint: Secondary | ICD-10-CM | POA: Diagnosis not present

## 2021-08-08 DIAGNOSIS — L409 Psoriasis, unspecified: Secondary | ICD-10-CM | POA: Diagnosis not present

## 2021-08-08 DIAGNOSIS — M85851 Other specified disorders of bone density and structure, right thigh: Secondary | ICD-10-CM | POA: Diagnosis not present

## 2021-08-22 ENCOUNTER — Ambulatory Visit: Payer: Self-pay | Admitting: Dermatology

## 2021-08-26 DIAGNOSIS — J069 Acute upper respiratory infection, unspecified: Secondary | ICD-10-CM | POA: Diagnosis not present

## 2021-08-27 ENCOUNTER — Telehealth: Payer: Self-pay

## 2021-08-27 NOTE — Telephone Encounter (Signed)
Patient scheduled with Dr Laurence Ferrari for Botox 3/8 and TBSE and psoriasis follow up on 3/9. :EM asking her to return call about appts. ?Lurlean Horns., RMA ?

## 2021-08-29 ENCOUNTER — Encounter: Payer: Self-pay | Admitting: Dermatology

## 2021-08-29 ENCOUNTER — Ambulatory Visit (INDEPENDENT_AMBULATORY_CARE_PROVIDER_SITE_OTHER): Payer: BC Managed Care – PPO | Admitting: Dermatology

## 2021-08-29 ENCOUNTER — Other Ambulatory Visit: Payer: Self-pay

## 2021-08-29 DIAGNOSIS — L988 Other specified disorders of the skin and subcutaneous tissue: Secondary | ICD-10-CM

## 2021-08-29 NOTE — Patient Instructions (Signed)

## 2021-08-29 NOTE — Progress Notes (Signed)
? ?  Follow-Up Visit ?  ?Subjective  ?Holly Le is a 56 y.o. female who presents for the following: Facial Elastosis (Here for Botox). Glabella, forehead, crows feet, perioral rhytids. ? ? ? ?The following portions of the chart were reviewed this encounter and updated as appropriate:   ?  ? ?Review of Systems: No other skin or systemic complaints except as noted in HPI or Assessment and Plan. ? ? ?Objective  ?Well appearing patient in no apparent distress; mood and affect are within normal limits. ? ?A focused examination was performed including face. Relevant physical exam findings are noted in the Assessment and Plan. ? ?face ?Rhytides and volume loss. See photo for injection pattern ? ? ? ? ? ?Assessment & Plan  ?Elastosis of skin ?face ? ?Botox 6 units to upper lip ?2 units to lower lip ?Upper face botox as previous ?  ?  ?Patient advised swelling of lips will be most prominent today and tomorrow then start to go down. ? ?Botox Injection - face ?Location: See attached image ? ?Informed consent: Discussed risks (infection, pain, bleeding, bruising, swelling, allergic reaction, paralysis of nearby muscles, eyelid droop, double vision, neck weakness, difficulty breathing, headache, undesirable cosmetic result, and need for additional treatment) and benefits of the procedure, as well as the alternatives.  Informed consent was obtained. ? ?Preparation: The area was cleansed with alcohol. ? ?Procedure Details:  Botox was injected into the dermis with a 30-gauge needle. Pressure applied to any bleeding. Ice packs offered for swelling. ? ?Lot Number:  [id number] ?Expiration:  08/2023 ? ?Total Units Injected:  56 ? ?Plan: Patient was instructed to remain upright for 4 hours. Patient was instructed to avoid massaging the face and avoid vigorous exercise for the rest of the day. Tylenol may be used for headache.  Allow 2 weeks before returning to clinic for additional dosing as needed. Patient will call for any  problems. ? ? ? ?Return for TBSE, Next Available with Dr. Laurence Ferrari. ? ?I, Emelia Salisbury, CMA, am acting as scribe for Brendolyn Patty, MD. ? ?Documentation: I have reviewed the above documentation for accuracy and completeness, and I agree with the above. ? ?Brendolyn Patty MD  ? ?

## 2021-08-30 ENCOUNTER — Encounter: Payer: BC Managed Care – PPO | Admitting: Dermatology

## 2021-09-16 ENCOUNTER — Encounter: Payer: Self-pay | Admitting: Nurse Practitioner

## 2021-09-16 ENCOUNTER — Other Ambulatory Visit: Payer: Self-pay

## 2021-09-16 DIAGNOSIS — U071 COVID-19: Secondary | ICD-10-CM | POA: Diagnosis not present

## 2021-09-16 MED ORDER — AZITHROMYCIN 250 MG PO TABS: 250.0000 mg | ORAL_TABLET | Freq: Every day | ORAL | 0 refills | Status: AC

## 2021-09-16 MED ORDER — PREDNISONE 10 MG PO TABS: 10.0000 mg | ORAL_TABLET | ORAL | 0 refills | Status: AC

## 2021-09-19 ENCOUNTER — Encounter: Payer: Self-pay | Admitting: Dermatology

## 2021-09-19 DIAGNOSIS — B009 Herpesviral infection, unspecified: Secondary | ICD-10-CM

## 2021-09-20 MED ORDER — ACYCLOVIR 5 % EX OINT: 1.0000 "application " | TOPICAL_OINTMENT | CUTANEOUS | 2 refills | Status: AC

## 2021-09-20 MED ORDER — VALACYCLOVIR HCL 500 MG PO TABS: 500.0000 mg | ORAL_TABLET | Freq: Two times a day (BID) | ORAL | 2 refills | Status: AC

## 2021-09-26 ENCOUNTER — Telehealth: Payer: Self-pay

## 2021-09-26 NOTE — Telephone Encounter (Signed)
Left vm and sent mychart message to confirm 10/02/21 appointment-Toni ?

## 2021-10-02 ENCOUNTER — Encounter: Payer: BC Managed Care – PPO | Admitting: Nurse Practitioner

## 2021-10-03 ENCOUNTER — Other Ambulatory Visit: Payer: Self-pay | Admitting: Nurse Practitioner

## 2021-10-03 DIAGNOSIS — F411 Generalized anxiety disorder: Secondary | ICD-10-CM

## 2021-11-09 ENCOUNTER — Other Ambulatory Visit: Payer: Self-pay | Admitting: Dermatology

## 2021-11-09 DIAGNOSIS — L409 Psoriasis, unspecified: Secondary | ICD-10-CM

## 2021-11-18 ENCOUNTER — Other Ambulatory Visit: Payer: Self-pay

## 2021-11-18 ENCOUNTER — Emergency Department
Admission: EM | Admit: 2021-11-18 | Discharge: 2021-11-18 | Disposition: A | Discharge status: Home / Self Care | Payer: BC Managed Care – PPO | Attending: Emergency Medicine | Admitting: Emergency Medicine

## 2021-11-18 ENCOUNTER — Emergency Department: Payer: BC Managed Care – PPO

## 2021-11-18 DIAGNOSIS — K5900 Constipation, unspecified: Secondary | ICD-10-CM | POA: Diagnosis not present

## 2021-11-18 DIAGNOSIS — R1111 Vomiting without nausea: Secondary | ICD-10-CM | POA: Diagnosis not present

## 2021-11-18 DIAGNOSIS — R0781 Pleurodynia: Secondary | ICD-10-CM | POA: Diagnosis not present

## 2021-11-18 DIAGNOSIS — R1012 Left upper quadrant pain: Secondary | ICD-10-CM | POA: Diagnosis not present

## 2021-11-18 DIAGNOSIS — N200 Calculus of kidney: Secondary | ICD-10-CM | POA: Diagnosis not present

## 2021-11-18 DIAGNOSIS — N3289 Other specified disorders of bladder: Secondary | ICD-10-CM | POA: Diagnosis not present

## 2021-11-18 DIAGNOSIS — R079 Chest pain, unspecified: Secondary | ICD-10-CM | POA: Diagnosis not present

## 2021-11-18 DIAGNOSIS — R109 Unspecified abdominal pain: Secondary | ICD-10-CM

## 2021-11-18 LAB — TROPONIN I (HIGH SENSITIVITY): Troponin I (High Sensitivity): 4 ng/L (ref <18)

## 2021-11-18 LAB — BASIC METABOLIC PANEL
Anion gap: 8 (ref 5–15)
BUN: 19 mg/dL (ref 6–20)
CO2: 28 mmol/L (ref 22–32)
Calcium: 9.1 mg/dL (ref 8.9–10.3)
Chloride: 104 mmol/L (ref 98–111)
Creatinine, Ser: 0.89 mg/dL (ref 0.44–1.00)
GFR, Estimated: >60 mL/min (ref >60)
Glucose, Bld: 129 mg/dL — ABNORMAL HIGH (ref 70–99)
Potassium: 3.9 mmol/L (ref 3.5–5.1)
Sodium: 140 mmol/L (ref 135–145)

## 2021-11-18 LAB — CBC
HCT: 40.8 % (ref 36.0–46.0)
Hemoglobin: 13.3 g/dL (ref 12.0–15.0)
MCH: 30.5 pg (ref 26.0–34.0)
MCHC: 32.6 g/dL (ref 30.0–36.0)
MCV: 93.6 fL (ref 80.0–100.0)
Platelets: 239 10*3/uL (ref 150–400)
RBC: 4.36 MIL/uL (ref 3.87–5.11)
RDW: 12.5 % (ref 11.5–15.5)
WBC: 7.9 10*3/uL (ref 4.0–10.5)
nRBC: 0 % (ref 0.0–0.2)

## 2021-11-18 LAB — HEPATIC FUNCTION PANEL
ALT: 17 U/L (ref 0–44)
AST: 23 U/L (ref 15–41)
Albumin: 4.3 g/dL (ref 3.5–5.0)
Alkaline Phosphatase: 96 U/L (ref 38–126)
Bilirubin, Direct: <0.1 mg/dL (ref 0.0–0.2)
Total Bilirubin: 0.5 mg/dL (ref 0.3–1.2)
Total Protein: 7.4 g/dL (ref 6.5–8.1)

## 2021-11-18 LAB — LIPASE, BLOOD: Lipase: 43 U/L (ref 11–51)

## 2021-11-18 MED ORDER — DICYCLOMINE HCL 10 MG PO CAPS
20.0000 mg | ORAL_CAPSULE | Freq: Once | ORAL | Status: AC
  Administered 2021-11-18: 20 mg via ORAL
  Filled 2021-11-18: qty 2

## 2021-11-18 MED ORDER — DOCUSATE SODIUM 100 MG PO CAPS: 100.0000 mg | ORAL_CAPSULE | Freq: Two times a day (BID) | ORAL | 2 refills | Status: AC

## 2021-11-18 MED ORDER — POLYETHYLENE GLYCOL 3350 17 GM/SCOOP PO POWD: 17.0000 g | Freq: Three times a day (TID) | ORAL | 0 refills | Status: AC | PRN

## 2021-11-18 NOTE — ED Triage Notes (Signed)
Pt states sharp left sided pain beneath her ribs off and on for three years. Pt states pain has increased in frequency and duration. Pt states pain makes her vomit "and takes my breath away".

## 2021-11-18 NOTE — ED Notes (Signed)
Pt denied pain in the moment then had a 'spasm' during assessment, pain an 8 at the time of spasm then subsided.  Pt states that pain gets up to a 10 at times and makes her vomit.

## 2021-11-18 NOTE — ED Provider Notes (Signed)
[facility] Provider Note    Event Date/Time   First MD Initiated Contact with Patient 11/18/21 2013     (approximate)  History   Chief Complaint: left rib pain  HPI  NORIE LATENDRESSE is a 56 y.o. female with a past medical history anxiety, depression, presents to the emergency department for left upper quadrant abdominal pain.  According to the patient for the past 2 to 3 years she has intermittently been experiencing sharp pains that occur just under her left rib and left upper quadrant of her abdomen.  She states often times she will have 1 or 2 pains a week or less however over the past month they have been much more frequently and over the past week or so has been daily in fact multiple times per day.  Patient states they are sharp and sudden when they occur lasting 1 second or so and then largely resolving though some mild pain recurs.  Patient denies any shortness of breath or diaphoresis.  Denies any significant constipation.  No urinary symptoms.  Physical Exam   Triage Vital Signs: ED Triage Vitals [11/18/21 1955]  Enc Vitals Group     BP (!) 122/109     Pulse Rate 81     Resp 18     Temp 98.6 F (37 C)     Temp Source Oral     SpO2 95 %     Weight 150 lb (68 kg)     Height '5\' 4"'$  (1.626 m)     Head Circumference      Peak Flow      Pain Score 8     Pain Loc      Pain Edu?      Excl. in [place]?     Most recent vital signs: Vitals:   11/18/21 1955  BP: (!) 122/109  Pulse: 81  Resp: 18  Temp: 98.6 F (37 C)  SpO2: 95%    General: Awake, no distress.  CV:  Good peripheral perfusion.  Regular rate and rhythm  Resp:  Normal effort.  Equal breath sounds bilaterally.  Abd:  No distention.  Soft, nontender.  No rebound or guarding.  Nontender to palpation.  Benign abdomen.    ED Results / Procedures / Treatments   EKG  EKG viewed and interpreted by myself shows a normal sinus rhythm at 76 bpm with a narrow QRS, normal axis, normal  intervals, no concerning ST changes.  RADIOLOGY  I have personally reviewed the chest x-ray images no significant abnormality seen on my interpretation. Radiology is read the chest x-ray is negative   MEDICATIONS ORDERED IN ED: Medications  dicyclomine (BENTYL) capsule 20 mg (has no administration in time range)     IMPRESSION / MDM / ASSESSMENT AND PLAN / ED COURSE  I reviewed the triage vital signs and the nursing notes.  Patient presents emergency department for intermittent sharp left upper quadrant pain.  During my evaluation patient had 2 of these pains that lasted approximate 1 to 2 seconds each they were sharp in nature patient would grab her left upper quadrant/left lower chest and then the pain will be gone.  Patient's symptoms are brief but intense per patient.  States they are coming more frequently.  States she has seen her doctor in the past for the same however she has never seen her doctor while she was actively experiencing the pain like she is today.  Patient symptoms are suggestive of possible intestinal  pain or constipation given they are sharp in nature and intermittent.  No pleuritic pain.  No pain in the chest.  The abdomen is also nontender.  We will check labs including cardiac enzymes but also abdominal labs.  We will obtain CT imaging of the abdomen.  Suspect patient could be suffering from constipation.  Patient agreeable to work-up.  Overall she appears well with reassuring vitals and a reassuring physical exam.  CT scan appears to show significant constipation but no other acute abnormality.  I reviewed the CT images patient appears to have stool throughout the left transverse and right colon.  We will place patient on Colace and MiraLAX have the patient follow-up with her doctor.  Patient agreeable to plan of care  FINAL CLINICAL IMPRESSION(S) / ED DIAGNOSES   Left upper quadrant abdominal pain Constipation   Note:  This document was prepared using Dragon  voice recognition software and may include unintentional dictation errors.   Harvest Dark, MD 11/18/21 2138

## 2021-11-20 ENCOUNTER — Telehealth: Payer: Self-pay

## 2021-11-20 NOTE — Telephone Encounter (Signed)
Lvm to schedule ED follow up-Toni 

## 2021-11-22 ENCOUNTER — Telehealth: Payer: Self-pay

## 2021-11-22 NOTE — Telephone Encounter (Signed)
Left another vm and sent mychart message to schedule ED follow up-Toni

## 2022-01-02 ENCOUNTER — Encounter: Payer: Self-pay | Admitting: Dermatology

## 2022-01-02 ENCOUNTER — Ambulatory Visit (INDEPENDENT_AMBULATORY_CARE_PROVIDER_SITE_OTHER): Payer: Self-pay | Admitting: Dermatology

## 2022-01-02 DIAGNOSIS — L988 Other specified disorders of the skin and subcutaneous tissue: Secondary | ICD-10-CM

## 2022-01-02 NOTE — Patient Instructions (Addendum)
Will prescribe Skin Medicinals Anti-Aging Tretinoin 0.025%/Niacinamide/Vitamin C/Vitamin E/Turmeric/Resveratrol with Hyaluronic Acid. Apply pea sized amount nightly to the entire face.  The patient was advised this is not covered by insurance since it is made by a compounding pharmacy. They will receive an email to check out and the medication will be mailed to their home.   Topical retinoid medications like tretinoin can cause dryness and irritation when first started. Only apply a pea-sized amount to the entire affected area. Avoid applying it around the eyes, edges of mouth and creases at the nose. If you experience irritation, use a good moisturizer first and/or apply the medicine less often. If you are doing well with the medicine, you can increase how often you use it until you are applying every night. Be careful with sun protection while using this medication as it can make you sensitive to the sun. This medicine should not be used by pregnant women.        Due to recent changes in healthcare laws, you may see results of your pathology and/or laboratory studies on MyChart before the doctors have had a chance to review them. We understand that in some cases there may be results that are confusing or concerning to you. Please understand that not all results are received at the same time and often the doctors may need to interpret multiple results in order to provide you with the best plan of care or course of treatment. Therefore, we ask that you please give [place] 2 business days to thoroughly review all your results before contacting the office for clarification. Should we see a critical lab result, you will be contacted sooner.   If You Need Anything After Your Visit  If you have any questions or concerns for your doctor, please call our main line at [phone] and press option 4 to reach your doctor's medical assistant. If no one answers, please leave a voicemail as directed and we will return  your call as soon as possible. Messages left after 4 pm will be answered the following business day.   You may also send [place] a message via [place]. We typically respond to MyChart messages within 1-2 business days.  For prescription refills, please ask your pharmacy to contact our office. Our fax number is [phone].  If you have an urgent issue when the clinic is closed that cannot wait until the next business day, you can page your doctor at the number below.    Please note that while we do our best to be available for urgent issues outside of office hours, we are not available 24/7.   If you have an urgent issue and are unable to reach [place], you may choose to seek medical care at your doctor's office, retail clinic, urgent care center, or emergency room.  If you have a medical emergency, please immediately call 911 or go to the emergency department.  Pager Numbers  - Dr. Nehemiah Massed: [phone]  - Dr. Laurence Ferrari: [phone]  - Dr. Nicole Kindred: [phone]  In the event of inclement weather, please call our main line at [phone] for an update on the status of any delays or closures.  Dermatology Medication Tips: Please keep the boxes that topical medications come in in order to help keep track of the instructions about where and how to use these. Pharmacies typically print the medication instructions only on the boxes and not directly on the medication tubes.   If your medication is too expensive, please contact our office at  [phone] option 4 or send [place] a message through [place].   We are unable to tell what your co-pay for medications will be in advance as this is different depending on your insurance coverage. However, we may be able to find a substitute medication at lower cost or fill out paperwork to get insurance to cover a needed medication.   If a prior authorization is required to get your medication covered by your insurance company, please allow [place] 1-2 business days to  complete this process.  Drug prices often vary depending on where the prescription is filled and some pharmacies may offer cheaper prices.  The website www.goodrx.com contains coupons for medications through different pharmacies. The prices here do not account for what the cost may be with help from insurance (it may be cheaper with your insurance), but the website can give you the price if you did not use any insurance.  - You can print the associated coupon and take it with your prescription to the pharmacy.  - You may also stop by our office during regular business hours and pick up a GoodRx coupon card.  - If you need your prescription sent electronically to a different pharmacy, notify our office through [facility] or by phone at [phone] option 4.     Si Usted Necesita Algo Despus de Su Visita  Tambin puede enviarnos un mensaje a travs de Pharmacist, community. Por lo general respondemos a los mensajes de MyChart en el transcurso de 1 a 2 das hbiles.  Para renovar recetas, por favor pida a su farmacia que se ponga en contacto con nuestra oficina. Harland Dingwall de fax es [place] [phone].  Si tiene un asunto urgente cuando la clnica est cerrada y que no puede esperar hasta el siguiente da hbil, puede llamar/localizar a su doctor(a) al nmero que aparece a continuacin.   Por favor, tenga en cuenta que aunque hacemos todo lo posible para estar disponibles para asuntos urgentes fuera del horario de [place], no estamos disponibles las 24 horas del da, los 7 das de la [place].   Si tiene un problema urgente y no puede comunicarse con nosotros, puede optar por buscar atencin mdica  en el consultorio de su doctor(a), en una clnica privada, en un centro de atencin urgente o en una sala de emergencias.  Si tiene Engineering geologist, por favor llame inmediatamente al 911 o vaya a la sala de emergencias.  Nmeros de bper  - Dr. Nehemiah Massed: [phone]  - Dra. Moye:  [phone]  - Dra. Nicole Kindred: [phone]  En caso de inclemencias del [place], por favor llame a Johnsie Kindred principal al [phone] para una actualizacin sobre el [place] de cualquier retraso o cierre.  Consejos para la medicacin en dermatologa: Por favor, guarde las cajas en las que vienen los medicamentos de uso tpico para ayudarle a seguir las instrucciones sobre dnde y cmo usarlos. Las farmacias generalmente imprimen las instrucciones del medicamento slo en las cajas y no directamente en los tubos del [place].   Si su medicamento es muy caro, por favor, pngase en contacto con Zigmund Daniel llamando al [phone] y presione la opcin 4 o envenos un mensaje a travs de Pharmacist, community.   No podemos decirle cul ser su copago por los medicamentos por adelantado ya que esto es diferente dependiendo de la cobertura de su seguro. Sin embargo, es posible que podamos encontrar un medicamento sustituto a Electrical engineer un formulario para que el seguro cubra el medicamento que se [place]  necesario.   Si se requiere una autorizacin previa para que su compaa de seguros [place] su medicamento, por favor permtanos de 1 a 2 das hbiles para completar este proceso.  Los precios de los medicamentos varan con frecuencia dependiendo del Environmental consultant de dnde se surte la receta y alguna farmacias pueden ofrecer precios ms baratos.  El sitio web www.goodrx.com tiene cupones para medicamentos de Airline pilot. Los precios aqu no tienen en cuenta lo que podra costar con la ayuda del seguro (puede ser ms barato con su seguro), pero el sitio web puede darle el precio si no utiliz Research scientist (physical sciences).  - Puede imprimir el cupn correspondiente y llevarlo con su receta a la farmacia.  - Tambin puede pasar por nuestra oficina durante el horario de atencin regular y Charity fundraiser una tarjeta de cupones de GoodRx.  - Si necesita que su receta se enve electrnicamente a una farmacia diferente,  informe a nuestra oficina a travs de MyChart de [place] o por telfono llamando al [phone] y presione la opcin 4.

## 2022-01-02 NOTE — Progress Notes (Signed)
Follow-Up Visit   Subjective  Holly Le is a 56 y.o. female who presents for the following: Facial Elastosis (Patient here for botox touch up and fillers. ).  The following portions of the chart were reviewed this encounter and updated as appropriate:  Tobacco  Allergies  Meds  Problems  Med Hx  Surg Hx  Fam Hx      Review of Systems: No other skin or systemic complaints except as noted in HPI or Assessment and Plan.  Objective  Well appearing patient in no apparent distress; mood and affect are within normal limits.  A focused examination was performed including face . Relevant physical exam findings are noted in the Assessment and Plan.  Head - Anterior (Face) Rhytides and volume loss.                     Assessment & Plan  Elastosis of skin Head - Anterior (Face)  Mid face filler - Voluma XC 1 syringe with canula  Lip filler - Restylane Refyne 1 syringe  56 units of Botox  forehead, lips, crows feet, and brow complex   [facility] Valtrex 500 mg tab - take 1 tab po bid x5 days to prevent cold sore from lip filler.  Will prescribe Skin Medicinals Anti-Aging Tretinoin 0.025%/Niacinamide/Vitamin C/Vitamin E/Turmeric/Resveratrol with Hyaluronic Acid. Apply pea sized amount nightly to the entire face.  The patient was advised this is not covered by insurance since it is made by a compounding pharmacy. They will receive an email to check out and the medication will be mailed to their home.   Also discussed Alastin restorative skin complex one pump twice a day to face  Topical retinoid medications like tretinoin can cause dryness and irritation when first started. Only apply a pea-sized amount to the entire affected area. Avoid applying it around the eyes, edges of mouth and creases at the nose. If you experience irritation, use a good moisturizer first and/or apply the medicine less often. If you are doing well with the medicine,  you can increase how often you use it until you are applying every night. Be careful with sun protection while using this medication as it can make you sensitive to the sun. This medicine should not be used by pregnant women.    Filling material injection - Head - Anterior (Face) Location: See attached image  Informed consent: Discussed risks (infection, pain, bleeding, bruising, swelling, allergic reaction, paralysis of nearby muscles, eyelid droop, double vision, neck weakness, difficulty breathing, headache, undesirable cosmetic result, and need for additional treatment) and benefits of the procedure, as well as the alternatives.  Informed consent was obtained.  Preparation: The area was cleansed with alcohol.  Procedure Details:  Botox was injected into the dermis with a 30-gauge needle. Pressure applied to any bleeding. Ice packs offered for swelling.  Lot Number:  [id number] Expiration:  10/23/2023  Total Units Injected:  56  Plan: Tylenol may be used for headache.  Allow 2 weeks before returning to clinic for additional dosing as needed. Patient will call for any problems.   Filling material injection - Head - Anterior (Face) Prior to the procedure, the patient's past medical history, allergies and the rare but potential risks and complications were reviewed with the patient and a signed consent was obtained. Pre and post-treatment care was discussed and instructions provided.  Risks including vascular occlusion were discussed.   Location: mid face   Filler Type: Juvederm  Voluma  Procedure: The area was prepped thoroughly with Puracyn. After introducing the needle into the desired treatment area, the syringe plunger was drawn back to ensure there was no flash of blood prior to injecting the filler in order to minimize risk of intravascular injection and vascular occlusion. A cannula was used to inject the filler. Insertion sites were prepped with puracyn and bupivacaine was injected to  achieve good local anesthesia. A 23-gauge needle was used to create an opening at the insertion site and then the cannula was inserted using sterile technique. Prior to injecting filler at the desired location, the syringe plunger was drawn back to ensure there was no flash of blood in order to minimize risk of intravascular injection and vascular occlusion.  After injection of the filler, the treated areas were cleansed and iced to reduce swelling. Post-treatment instructions were reviewed with the patient.       Patient tolerated the procedure well. The patient will call with any problems, questions or concerns prior to their next appointment.  1.0 ml  Lot #[id number] Exp 2022-08-01    Filling material injection - Head - Anterior (Face) Prior to the procedure, the patient's past medical history, allergies and the rare but potential risks and complications were reviewed with the patient and a signed consent was obtained. Pre and post-treatment care was discussed and instructions provided.  Risks including vascular occlusion were discussed.   Location: lips  Filler Type: Restylane refyne  Procedure: Lidocaine-tetracaine ointment was applied to treatment areas to achieve good local anesthesia. The area was prepped thoroughly with Puracyn. After introducing the needle into the desired treatment area, the syringe plunger was drawn back to ensure there was no flash of blood prior to injecting the filler in order to minimize risk of intravascular injection and vascular occlusion. After injection of the filler, the treated areas were cleansed and iced to reduce swelling. Post-treatment instructions were reviewed with the patient.       Patient tolerated the procedure well. The patient will call with any problems, questions or concerns prior to their next appointment. 1 syringe Lot # [id number] Exp 2022-10-22      Return for 3 - 4 month botox . I, Ruthell Rummage, CMA, am acting as scribe for Forest Gleason, MD.  Documentation: I have reviewed the above documentation for accuracy and completeness, and I agree with the above.  Forest Gleason, MD

## 2022-01-10 ENCOUNTER — Encounter: Payer: Self-pay | Admitting: Dermatology

## 2022-01-11 ENCOUNTER — Other Ambulatory Visit: Payer: Self-pay | Admitting: Dermatology

## 2022-01-11 DIAGNOSIS — L409 Psoriasis, unspecified: Secondary | ICD-10-CM

## 2022-02-27 ENCOUNTER — Other Ambulatory Visit: Payer: Self-pay | Admitting: Nurse Practitioner

## 2022-02-27 DIAGNOSIS — F411 Generalized anxiety disorder: Secondary | ICD-10-CM

## 2022-02-27 NOTE — Telephone Encounter (Signed)
Pt needs appt for future refills 

## 2022-03-01 DIAGNOSIS — M25511 Pain in right shoulder: Secondary | ICD-10-CM | POA: Diagnosis not present

## 2022-03-01 DIAGNOSIS — Z13220 Encounter for screening for lipoid disorders: Secondary | ICD-10-CM | POA: Diagnosis not present

## 2022-03-01 DIAGNOSIS — R635 Abnormal weight gain: Secondary | ICD-10-CM | POA: Diagnosis not present

## 2022-03-01 DIAGNOSIS — R1013 Epigastric pain: Secondary | ICD-10-CM | POA: Diagnosis not present

## 2022-03-01 DIAGNOSIS — G8929 Other chronic pain: Secondary | ICD-10-CM | POA: Diagnosis not present

## 2022-03-01 DIAGNOSIS — Z131 Encounter for screening for diabetes mellitus: Secondary | ICD-10-CM | POA: Diagnosis not present

## 2022-03-01 DIAGNOSIS — Z780 Asymptomatic menopausal state: Secondary | ICD-10-CM | POA: Diagnosis not present

## 2022-03-01 DIAGNOSIS — Z0000 Encounter for general adult medical examination without abnormal findings: Secondary | ICD-10-CM | POA: Diagnosis not present

## 2022-03-01 DIAGNOSIS — Z8639 Personal history of other endocrine, nutritional and metabolic disease: Secondary | ICD-10-CM | POA: Diagnosis not present

## 2022-03-05 ENCOUNTER — Other Ambulatory Visit: Payer: Self-pay | Admitting: Internal Medicine

## 2022-03-05 DIAGNOSIS — R1013 Epigastric pain: Secondary | ICD-10-CM

## 2022-03-05 DIAGNOSIS — R109 Unspecified abdominal pain: Secondary | ICD-10-CM

## 2022-03-08 ENCOUNTER — Other Ambulatory Visit: Payer: Self-pay | Admitting: Dermatology

## 2022-03-08 DIAGNOSIS — L409 Psoriasis, unspecified: Secondary | ICD-10-CM

## 2022-03-14 ENCOUNTER — Ambulatory Visit (INDEPENDENT_AMBULATORY_CARE_PROVIDER_SITE_OTHER): Payer: BC Managed Care – PPO | Admitting: Dermatology

## 2022-03-14 DIAGNOSIS — L309 Dermatitis, unspecified: Secondary | ICD-10-CM

## 2022-03-14 DIAGNOSIS — D239 Other benign neoplasm of skin, unspecified: Secondary | ICD-10-CM

## 2022-03-14 DIAGNOSIS — Z1283 Encounter for screening for malignant neoplasm of skin: Secondary | ICD-10-CM | POA: Diagnosis not present

## 2022-03-14 DIAGNOSIS — L409 Psoriasis, unspecified: Secondary | ICD-10-CM

## 2022-03-14 DIAGNOSIS — L280 Lichen simplex chronicus: Secondary | ICD-10-CM

## 2022-03-14 DIAGNOSIS — D1801 Hemangioma of skin and subcutaneous tissue: Secondary | ICD-10-CM

## 2022-03-14 DIAGNOSIS — L814 Other melanin hyperpigmentation: Secondary | ICD-10-CM

## 2022-03-14 DIAGNOSIS — D229 Melanocytic nevi, unspecified: Secondary | ICD-10-CM

## 2022-03-14 DIAGNOSIS — Z79899 Other long term (current) drug therapy: Secondary | ICD-10-CM

## 2022-03-14 DIAGNOSIS — Z86018 Personal history of other benign neoplasm: Secondary | ICD-10-CM

## 2022-03-14 DIAGNOSIS — L578 Other skin changes due to chronic exposure to nonionizing radiation: Secondary | ICD-10-CM

## 2022-03-14 DIAGNOSIS — L821 Other seborrheic keratosis: Secondary | ICD-10-CM

## 2022-03-14 NOTE — Patient Instructions (Addendum)
Start sample of Zoryve once daily to affected area at right breast.  Lot #  Can use clobetasol twice a day for up to 2 weeks with tacrolimus.  Start clobetasol followed by Protopic 1-2 times daily and cover with band aid for up to 2 weeks to spot at right upper arm.  Side effects of Otezla (apremilast) include diarrhea, nausea, headache, upper respiratory infection, depression, and weight decrease (5-10%). It should only be taken by pregnant women after a discussion regarding risks and benefits with their doctor. Goal is control of skin condition, not cure.  The use of Rutherford Nail requires long term medication management, including periodic office visits.  Reviewed risks of biologics including immunosuppression, infections, injection site reaction, and failure to improve condition. Goal is control of skin condition, not cure.  Some older biologics such as Humira and Enbrel may slightly increase risk of malignancy and may worsen congestive heart failure.  Talz and Cosentyx may cause inflammatory bowel disease to flare. The use of biologics requires long term medication management, including periodic office visits and monitoring of blood work.  Recommend taking Heliocare sun protection supplement daily in sunny weather for additional sun protection. For maximum protection on the sunniest days, you can take up to 2 capsules of regular Heliocare OR take 1 capsule of Heliocare Ultra. For prolonged exposure (such as a full day in the sun), you can repeat your dose of the supplement 4 hours after your first dose. Heliocare can be purchased at [employer], at some [facility] or at [email or web address].    Melanoma ABCDEs  Melanoma is the most dangerous type of skin cancer, and is the leading cause of death from skin disease.  You are more likely to develop melanoma if you: Have light-colored skin, light-colored eyes, or red or blond hair Spend a lot of time in the sun Tan regularly, either outdoors or in a  tanning bed Have had blistering sunburns, especially during childhood Have a close family member who has had a melanoma Have atypical moles or large birthmarks  Early detection of melanoma is key since treatment is typically straightforward and cure rates are extremely high if we catch it early.   The first sign of melanoma is often a change in a mole or a new dark spot.  The ABCDE system is a way of remembering the signs of melanoma.  A for asymmetry:  The two halves do not match. B for border:  The edges of the growth are irregular. C for color:  A mixture of colors are present instead of an even brown color. D for diameter:  Melanomas are usually (but not always) greater than 65m - the size of a pencil eraser. E for evolution:  The spot keeps changing in size, shape, and color.  Please check your skin once per month between visits. You can use a small mirror in front and a large mirror behind you to keep an eye on the back side or your body.   If you see any new or changing lesions before your next follow-up, please call to schedule a visit.  Please continue daily skin protection including broad spectrum sunscreen SPF 30+ to sun-exposed areas, reapplying every 2 hours as needed when you're outdoors.    Due to recent changes in healthcare laws, you may see results of your pathology and/or laboratory studies on MyChart before the doctors have had a chance to review them. We understand that in some cases there may be results that are confusing  or concerning to you. Please understand that not all results are received at the same time and often the doctors may need to interpret multiple results in order to provide you with the best plan of care or course of treatment. Therefore, we ask that you please give [place] 2 business days to thoroughly review all your results before contacting the office for clarification. Should we see a critical lab result, you will be contacted sooner.   If You Need  Anything After Your Visit  If you have any questions or concerns for your doctor, please call our main line at [phone] and press option 4 to reach your doctor's medical assistant. If no one answers, please leave a voicemail as directed and we will return your call as soon as possible. Messages left after 4 pm will be answered the following business day.   You may also send [place] a message via [place]. We typically respond to MyChart messages within 1-2 business days.  For prescription refills, please ask your pharmacy to contact our office. Our fax number is [phone].  If you have an urgent issue when the clinic is closed that cannot wait until the next business day, you can page your doctor at the number below.    Please note that while we do our best to be available for urgent issues outside of office hours, we are not available 24/7.   If you have an urgent issue and are unable to reach [place], you may choose to seek medical care at your doctor's office, retail clinic, urgent care center, or emergency room.  If you have a medical emergency, please immediately call 911 or go to the emergency department.  Pager Numbers  - Dr. Nehemiah Massed: [phone]  - Dr. Laurence Ferrari: [phone]  - Dr. Nicole Kindred: [phone]  In the event of inclement weather, please call our main line at [phone] for an update on the status of any delays or closures.  Dermatology Medication Tips: Please keep the boxes that topical medications come in in order to help keep track of the instructions about where and how to use these. Pharmacies typically print the medication instructions only on the boxes and not directly on the medication tubes.   If your medication is too expensive, please contact our office at [phone] option 4 or send [place] a message through [place].   We are unable to tell what your co-pay for medications will be in advance as this is different depending on your insurance coverage. However, we may  be able to find a substitute medication at lower cost or fill out paperwork to get insurance to cover a needed medication.   If a prior authorization is required to get your medication covered by your insurance company, please allow [place] 1-2 business days to complete this process.  Drug prices often vary depending on where the prescription is filled and some pharmacies may offer cheaper prices.  The website www.goodrx.com contains coupons for medications through different pharmacies. The prices here do not account for what the cost may be with help from insurance (it may be cheaper with your insurance), but the website can give you the price if you did not use any insurance.  - You can print the associated coupon and take it with your prescription to the pharmacy.  - You may also stop by our office during regular business hours and pick up a GoodRx coupon card.  - If you need your prescription sent electronically to a different pharmacy, notify our office  through [facility] or by phone at [phone] option 4.     Si Usted Necesita Algo Despus de Su Visita  Tambin puede enviarnos un mensaje a travs de Pharmacist, community. Por lo general respondemos a los mensajes de MyChart en el transcurso de 1 a 2 das hbiles.  Para renovar recetas, por favor pida a su farmacia que se ponga en contacto con nuestra oficina. Harland Dingwall de fax es [place] [phone].  Si tiene un asunto urgente cuando la clnica est cerrada y que no puede esperar hasta el siguiente da hbil, puede llamar/localizar a su doctor(a) al nmero que aparece a continuacin.   Por favor, tenga en cuenta que aunque hacemos todo lo posible para estar disponibles para asuntos urgentes fuera del horario de [place], no estamos disponibles las 24 horas del da, los 7 das de la [place].   Si tiene un problema urgente y no puede comunicarse con nosotros, puede optar por buscar atencin mdica  en el consultorio de su doctor(a), en una  clnica privada, en un centro de atencin urgente o en una sala de emergencias.  Si tiene Engineering geologist, por favor llame inmediatamente al 911 o vaya a la sala de emergencias.  Nmeros de bper  - Dr. Nehemiah Massed: [phone]  - Dra. Moye: [phone]  - Dra. Nicole Kindred: [phone]  En caso de inclemencias del [place], por favor llame a Johnsie Kindred principal al [phone] para una actualizacin sobre el [place] de cualquier retraso o cierre.  Consejos para la medicacin en dermatologa: Por favor, guarde las cajas en las que vienen los medicamentos de uso tpico para ayudarle a seguir las instrucciones sobre dnde y cmo usarlos. Las farmacias generalmente imprimen las instrucciones del medicamento slo en las cajas y no directamente en los tubos del [place].   Si su medicamento es muy caro, por favor, pngase en contacto con Zigmund Daniel llamando al [phone] y presione la opcin 4 o envenos un mensaje a travs de Pharmacist, community.   No podemos decirle cul ser su copago por los medicamentos por adelantado ya que esto es diferente dependiendo de la cobertura de su seguro. Sin embargo, es posible que podamos encontrar un medicamento sustituto a Electrical engineer un formulario para que el seguro cubra el medicamento que se considera necesario.   Si se requiere una autorizacin previa para que su compaa de seguros [place] su medicamento, por favor permtanos de 1 a 2 das hbiles para completar este proceso.  Los precios de los medicamentos varan con frecuencia dependiendo del Environmental consultant de dnde se surte la receta y alguna farmacias pueden ofrecer precios ms baratos.  El sitio web www.goodrx.com tiene cupones para medicamentos de Airline pilot. Los precios aqu no tienen en cuenta lo que podra costar con la ayuda del seguro (puede ser ms barato con su seguro), pero el sitio web puede darle el precio si no utiliz Research scientist (physical sciences).  - Puede imprimir el cupn correspondiente y  llevarlo con su receta a la farmacia.  - Tambin puede pasar por nuestra oficina durante el horario de atencin regular y Charity fundraiser una tarjeta de cupones de GoodRx.  - Si necesita que su receta se enve electrnicamente a una farmacia diferente, informe a nuestra oficina a travs de MyChart de [place] o por telfono llamando al [phone] y presione la opcin 4.

## 2022-03-14 NOTE — Progress Notes (Signed)
Follow-Up Visit   Subjective  Holly Le is a 56 y.o. female who presents for the following: Annual Exam (The patient presents for Total-Body Skin Exam (TBSE) for skin cancer screening and mole check.  The patient has spots, moles and lesions to be evaluated, some may be new or changing and the patient has concerns that these could be cancer. Patient with hx of dysplastic nevi. ) and Psoriasis (Patient currently taking Otezla and using clobetasol solution. Patient continues to have trouble with psoriasis at scalp. She saw rheumatology in February and Dr. Posey Pronto did not see any signs of psoriatic arthritis. ).  Patient had a spot of LSC at right upper arm treated in past with LN2 but has not resolved.   The following portions of the chart were reviewed this encounter and updated as appropriate:   Tobacco  Allergies  Meds  Problems  Med Hx  Surg Hx  Fam Hx      Review of Systems:  No other skin or systemic complaints except as noted in HPI or Assessment and Plan.  Objective  Well appearing patient in no apparent distress; mood and affect are within normal limits.  A full examination was performed including scalp, head, eyes, ears, nose, lips, neck, chest, axillae, abdomen, back, buttocks, bilateral upper extremities, bilateral lower extremities, hands, feet, fingers, toes, fingernails, and toenails. All findings within normal limits unless otherwise noted below.  Scalp Thin scaly pink plaques at scalp  Right Upper Arm Lichenified small plaque  Right Breast Erythematous patches without induration or scale     Assessment & Plan  Psoriasis Scalp  Chronic and persistent condition with duration or expected duration over one year. Condition is symptomatic/ bothersome to patient. Not currently at goal.  Psoriasis is a chronic non-curable, but treatable genetic/hereditary disease that may have other systemic features affecting other organ systems such as joints (Psoriatic  Arthritis). It is associated with an increased risk of inflammatory bowel disease, heart disease, non-alcoholic fatty liver disease, and depression.    Pending lab results will submit for Skyrizi.  Ordered additional labs in case rheumatology wants to switch her to anti-TNF in future. Continue Otezla 30 mg twice daily until [employer] approved, then stop.  Side effects of Otezla (apremilast) include diarrhea, nausea, headache, upper respiratory infection, depression, and weight decrease (5-10%). It should only be taken by pregnant women after a discussion regarding risks and benefits with their doctor. Goal is control of skin condition, not cure.  The use of Rutherford Nail requires long term medication management, including periodic office visits.  Reviewed risks of biologics including immunosuppression, infections, injection site reaction, and failure to improve condition. Goal is control of skin condition, not cure.  Some older biologics such as Humira and Enbrel may slightly increase risk of malignancy and may worsen congestive heart failure.  Talz and Cosentyx may cause inflammatory bowel disease to flare. The use of biologics requires long term medication management, including periodic office visits and monitoring of blood work.  Related Procedures QuantiFERON-TB Gold Plus HIV antibody (with reflex) Hep B Surface Antibody Hep B Surface Antigen Hepatitis B Core AB, Total Hep C Antibody  Related Medications hydrOXYzine (ATARAX) 25 MG tablet Take 1 tablet (25 mg total) by mouth See admin instructions. 1 - 2 times daily as needed for itching. May cause drowsiness  tacrolimus (PROTOPIC) 0.1 % ointment Apply topically 2 (two) times daily.  clobetasol (TEMOVATE) 0.05 % external solution Apply 1 application topically See admin instructions. Apply topically 2 (two)  times daily to scalp for psoriasis. Avoid applying to face, groin, and axilla. Use as directed. Long-term use can cause thinning of the  skin.  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 Discussed LN2 but deferred today.  Start clobetasol followed by Protopic 1-2 times daily and cover with band aid for up to 2 weeks.   Topical steroids (such as triamcinolone, fluocinolone, fluocinonide, mometasone, clobetasol, halobetasol, betamethasone, hydrocortisone) can cause thinning and lightening of the skin if they are used for too long in the same area. Your physician has selected the right strength medicine for your problem and area affected on the body. Please use your medication only as directed by your physician to prevent side effects.    Eczema, unspecified type Right Breast  Chronic and persistent condition with duration or expected duration over one year. Condition is symptomatic/ bothersome to patient. Not currently at goal.   Start sample of Zoryve once daily to affected area at right breast.  Lot #  Can use clobetasol twice a day for up to 2 weeks. Can use tacrolimus twice a day as needed.  Topical steroids (such as triamcinolone, fluocinolone, fluocinonide, mometasone, clobetasol, halobetasol, betamethasone, hydrocortisone) can cause thinning and lightening of the skin if they are used for too long in the same area. Your physician has selected the right strength medicine for your problem and area affected on the body. Please use your medication only as directed by your physician to prevent side effects.     History of Dysplastic Nevi - No evidence of recurrence today - Recommend regular full body skin exams - Recommend daily broad spectrum sunscreen SPF 30+ to sun-exposed areas, reapply every 2 hours as needed.  - Call if any new or changing lesions are noted between office visits  Lentigines - Scattered tan macules - Due to sun exposure - Benign-appearing, observe - Recommend daily broad spectrum sunscreen SPF 30+ to  sun-exposed areas, reapply every 2 hours as needed. - Call for any changes  Seborrheic Keratoses - Stuck-on, waxy, tan-brown papules and/or plaques  - Benign-appearing - Discussed benign etiology and prognosis. - Observe - Call for any changes  Melanocytic Nevi - Tan-brown and/or pink-flesh-colored symmetric macules and papules - Benign appearing on exam today - Observation - Call clinic for new or changing moles - Recommend daily use of broad spectrum spf 30+ sunscreen to sun-exposed areas.   Hemangiomas - Red papules - Discussed benign nature - Observe - Call for any changes  Actinic Damage - Chronic condition, secondary to cumulative UV/sun exposure - diffuse scaly erythematous macules with underlying dyspigmentation - Recommend daily broad spectrum sunscreen SPF 30+ to sun-exposed areas, reapply every 2 hours as needed.  - Staying in the shade or wearing long sleeves, sun glasses (UVA+UVB protection) and wide brim hats (4-inch brim around the entire circumference of the hat) are also recommended for sun protection.  - Call for new or changing lesions.  Skin cancer screening performed today.  Dermatofibroma - Firm pink/brown papulenodule with dimple sign - Benign appearing - Call for any changes  Return for Psoriasis 3-4 months.  Graciella Belton, RMA, am acting as scribe for Forest Gleason, MD .  Documentation: I have reviewed the above documentation for accuracy and completeness, and I agree with the above.  Forest Gleason, MD

## 2022-03-16 ENCOUNTER — Encounter: Payer: Self-pay | Admitting: Dermatology

## 2022-03-20 ENCOUNTER — Ambulatory Visit
Admission: RE | Admit: 2022-03-20 | Discharge: 2022-03-20 | Disposition: A | Discharge status: Home / Self Care | Payer: BC Managed Care – PPO | Source: Ambulatory Visit | Attending: Internal Medicine | Admitting: Internal Medicine

## 2022-03-20 DIAGNOSIS — R109 Unspecified abdominal pain: Secondary | ICD-10-CM | POA: Diagnosis not present

## 2022-03-20 DIAGNOSIS — N281 Cyst of kidney, acquired: Secondary | ICD-10-CM | POA: Diagnosis not present

## 2022-03-20 DIAGNOSIS — R1013 Epigastric pain: Secondary | ICD-10-CM | POA: Diagnosis not present

## 2022-03-20 MED ORDER — IOHEXOL 300 MG/ML  SOLN
100.0000 mL | Freq: Once | INTRAMUSCULAR | Status: AC | PRN
  Administered 2022-03-20: 100 mL via INTRAVENOUS

## 2022-04-08 ENCOUNTER — Other Ambulatory Visit: Payer: Self-pay | Admitting: Nurse Practitioner

## 2022-04-08 DIAGNOSIS — F411 Generalized anxiety disorder: Secondary | ICD-10-CM

## 2022-04-29 ENCOUNTER — Other Ambulatory Visit: Payer: Self-pay | Admitting: Dermatology

## 2022-04-29 DIAGNOSIS — L409 Psoriasis, unspecified: Secondary | ICD-10-CM | POA: Diagnosis not present

## 2022-04-30 ENCOUNTER — Other Ambulatory Visit: Payer: Self-pay | Admitting: Nurse Practitioner

## 2022-04-30 DIAGNOSIS — F411 Generalized anxiety disorder: Secondary | ICD-10-CM

## 2022-04-30 LAB — HEPATITIS B SURFACE ANTIGEN: Hepatitis B Surface Ag: NEGATIVE

## 2022-04-30 LAB — HEPATITIS C ANTIBODY: Hep C Virus Ab: NONREACTIVE

## 2022-04-30 LAB — HEPATITIS B CORE ANTIBODY, TOTAL: Hep B Core Total Ab: NEGATIVE

## 2022-04-30 LAB — HEPATITIS B SURFACE ANTIBODY,QUALITATIVE: Hep B Surface Ab, Qual: NONREACTIVE

## 2022-04-30 LAB — HIV ANTIBODY (ROUTINE TESTING W REFLEX): HIV Screen 4th Generation wRfx: NONREACTIVE

## 2022-05-01 ENCOUNTER — Ambulatory Visit: Payer: BC Managed Care – PPO | Admitting: Dermatology

## 2022-05-01 ENCOUNTER — Encounter: Payer: Self-pay | Admitting: Dermatology

## 2022-05-01 ENCOUNTER — Telehealth: Payer: Self-pay

## 2022-05-01 LAB — QUANTIFERON-TB GOLD PLUS
QuantiFERON Mitogen Value: 1.65 IU/mL
QuantiFERON Nil Value: 0.01 IU/mL
QuantiFERON TB1 Ag Value: 0.01 IU/mL
QuantiFERON TB2 Ag Value: 0.01 IU/mL
QuantiFERON-TB Gold Plus: NEGATIVE

## 2022-05-01 MED ORDER — SKYRIZI PEN 150 MG/ML ~~LOC~~ SOAJ: 150.0000 mg | SUBCUTANEOUS | 2 refills | Status: DC

## 2022-05-01 MED ORDER — SKYRIZI PEN 150 MG/ML ~~LOC~~ SOAJ: 150.0000 mg | SUBCUTANEOUS | 1 refills | Status: DC

## 2022-05-01 NOTE — Telephone Encounter (Signed)
Patient advised of labwork through [employer] and Skyrizi submitted to pharmacy. aw

## 2022-05-02 ENCOUNTER — Telehealth: Payer: Self-pay | Admitting: Dermatology

## 2022-05-02 NOTE — Telephone Encounter (Signed)
Spoke with pt. Discussed Skyrizi vs Taltz vs Humira. Recommend follow-up with rheumatology (MAs please send a copy of note to rheumatology and request follow-up appointment per their last note). Submitted for [employer]. Putting Otezla samples at front desk for her to pick up tomorrow to continue until [employer]	[place].

## 2022-05-03 DIAGNOSIS — Z23 Encounter for immunization: Secondary | ICD-10-CM | POA: Diagnosis not present

## 2022-05-03 DIAGNOSIS — K2900 Acute gastritis without bleeding: Secondary | ICD-10-CM | POA: Diagnosis not present

## 2022-05-03 DIAGNOSIS — Z8639 Personal history of other endocrine, nutritional and metabolic disease: Secondary | ICD-10-CM | POA: Diagnosis not present

## 2022-05-03 DIAGNOSIS — R1013 Epigastric pain: Secondary | ICD-10-CM | POA: Diagnosis not present

## 2022-05-06 NOTE — Telephone Encounter (Signed)
Faxed last visit not to rheumatology and requested follow up appointment.

## 2022-05-08 ENCOUNTER — Ambulatory Visit: Payer: BC Managed Care – PPO | Admitting: Dermatology

## 2022-05-15 ENCOUNTER — Ambulatory Visit: Payer: BC Managed Care – PPO | Admitting: Dermatology

## 2022-05-30 ENCOUNTER — Other Ambulatory Visit: Payer: Self-pay

## 2022-05-30 MED ORDER — SKYRIZI PEN 150 MG/ML ~~LOC~~ SOAJ: 150.0000 mg | SUBCUTANEOUS | 2 refills | Status: DC

## 2022-05-30 MED ORDER — SKYRIZI PEN 150 MG/ML ~~LOC~~ SOAJ: 150.0000 mg | SUBCUTANEOUS | 1 refills | Status: DC

## 2022-05-30 NOTE — Progress Notes (Signed)
Fax from [facility] requesting pharmacy change from them to [facility]. Escripted

## 2022-05-30 NOTE — Progress Notes (Signed)
Fax from [facility] requesting pharmacy change from them to [facility]. Escripted

## 2022-06-10 ENCOUNTER — Ambulatory Visit (INDEPENDENT_AMBULATORY_CARE_PROVIDER_SITE_OTHER): Payer: Self-pay | Admitting: Dermatology

## 2022-06-10 ENCOUNTER — Encounter: Payer: Self-pay | Admitting: Dermatology

## 2022-06-10 ENCOUNTER — Telehealth: Payer: Self-pay

## 2022-06-10 DIAGNOSIS — L988 Other specified disorders of the skin and subcutaneous tissue: Secondary | ICD-10-CM

## 2022-06-10 NOTE — Patient Instructions (Signed)
Due to recent changes in healthcare laws, you may see results of your pathology and/or laboratory studies on MyChart before the doctors have had a chance to review them. We understand that in some cases there may be results that are confusing or concerning to you. Please understand that not all results are received at the same time and often the doctors may need to interpret multiple results in order to provide you with the best plan of care or course of treatment. Therefore, we ask that you please give us 2 business days to thoroughly review all your results before contacting the office for clarification. Should we see a critical lab result, you will be contacted sooner.   If You Need Anything After Your Visit  If you have any questions or concerns for your doctor, please call our main line at [phone] and press option 4 to reach your doctor's medical assistant. If no one answers, please leave a voicemail as directed and we will return your call as soon as possible. Messages left after 4 pm will be answered the following business day.   You may also send us a message via MyChart. We typically respond to MyChart messages within 1-2 business days.  For prescription refills, please ask your pharmacy to contact our office. Our fax number is [phone].  If you have an urgent issue when the clinic is closed that cannot wait until the next business day, you can page your doctor at the number below.    Please note that while we do our best to be available for urgent issues outside of office hours, we are not available 24/7.   If you have an urgent issue and are unable to reach us, you may choose to seek medical care at your doctor's office, retail clinic, urgent care center, or emergency room.  If you have a medical emergency, please immediately call 911 or go to the emergency department.  Pager Numbers  - Dr. Kowalski: [phone]  - Dr. Moye: [phone]  - Dr. Stewart:  [phone]  In the event of inclement weather, please call our main line at [phone] for an update on the status of any delays or closures.  Dermatology Medication Tips: Please keep the boxes that topical medications come in in order to help keep track of the instructions about where and how to use these. Pharmacies typically print the medication instructions only on the boxes and not directly on the medication tubes.   If your medication is too expensive, please contact our office at [phone] option 4 or send us a message through MyChart.   We are unable to tell what your co-pay for medications will be in advance as this is different depending on your insurance coverage. However, we may be able to find a substitute medication at lower cost or fill out paperwork to get insurance to cover a needed medication.   If a prior authorization is required to get your medication covered by your insurance company, please allow us 1-2 business days to complete this process.  Drug prices often vary depending on where the prescription is filled and some pharmacies may offer cheaper prices.  The website www.goodrx.com contains coupons for medications through different pharmacies. The prices here do not account for what the cost may be with help from insurance (it may be cheaper with your insurance), but the website can give you the price if you did not use any insurance.  - You can print the associated coupon and take it with   your prescription to the pharmacy.  - You may also stop by our office during regular business hours and pick up a GoodRx coupon card.  - If you need your prescription sent electronically to a different pharmacy, notify our office through [place] MyChart or by phone at [phone] option 4.     Si Usted Necesita Algo Despus de Su Visita  Tambin puede enviarnos un mensaje a travs de MyChart. Por lo general respondemos a los mensajes de MyChart en el transcurso de 1 a 2  das hbiles.  Para renovar recetas, por favor pida a su farmacia que se ponga en contacto con nuestra oficina. Nuestro nmero de fax es el [phone].  Si tiene un asunto urgente cuando la clnica est cerrada y que no puede esperar hasta el siguiente da hbil, puede llamar/localizar a su doctor(a) al nmero que aparece a continuacin.   Por favor, tenga en cuenta que aunque hacemos todo lo posible para estar disponibles para asuntos urgentes fuera del horario de oficina, no estamos disponibles las 24 horas del da, los 7 das de la semana.   Si tiene un problema urgente y no puede comunicarse con nosotros, puede optar por buscar atencin mdica  en el consultorio de su doctor(a), en una clnica privada, en un centro de atencin urgente o en una sala de emergencias.  Si tiene una emergencia mdica, por favor llame inmediatamente al 911 o vaya a la sala de emergencias.  Nmeros de bper  - Dr. Kowalski: [phone]  - Dra. Moye: [phone]  - Dra. Stewart: [phone]  En caso de inclemencias del tiempo, por favor llame a nuestra lnea principal al [phone] para una actualizacin sobre el estado de cualquier retraso o cierre.  Consejos para la medicacin en dermatologa: Por favor, guarde las cajas en las que vienen los medicamentos de uso tpico para ayudarle a seguir las instrucciones sobre dnde y cmo usarlos. Las farmacias generalmente imprimen las instrucciones del medicamento slo en las cajas y no directamente en los tubos del medicamento.   Si su medicamento es muy caro, por favor, pngase en contacto con nuestra oficina llamando al [phone] y presione la opcin 4 o envenos un mensaje a travs de MyChart.   No podemos decirle cul ser su copago por los medicamentos por adelantado ya que esto es diferente dependiendo de la cobertura de su seguro. Sin embargo, es posible que podamos encontrar un medicamento sustituto a menor costo o llenar un formulario para que el  seguro cubra el medicamento que se considera necesario.   Si se requiere una autorizacin previa para que su compaa de seguros cubra su medicamento, por favor permtanos de 1 a 2 das hbiles para completar este proceso.  Los precios de los medicamentos varan con frecuencia dependiendo del lugar de dnde se surte la receta y alguna farmacias pueden ofrecer precios ms baratos.  El sitio web www.goodrx.com tiene cupones para medicamentos de diferentes farmacias. Los precios aqu no tienen en cuenta lo que podra costar con la ayuda del seguro (puede ser ms barato con su seguro), pero el sitio web puede darle el precio si no utiliz ningn seguro.  - Puede imprimir el cupn correspondiente y llevarlo con su receta a la farmacia.  - Tambin puede pasar por nuestra oficina durante el horario de atencin regular y recoger una tarjeta de cupones de GoodRx.  - Si necesita que su receta se enve electrnicamente a una farmacia diferente, informe a nuestra oficina a travs de MyChart de [place]   o por telfono llamando al [phone] y presione la opcin 4.  

## 2022-06-10 NOTE — Progress Notes (Signed)
   Follow-Up Visit   Subjective  Holly Le is a 56 y.o. female who presents for the following: Facial Elastosis (Patient here today for Botox injections. ).   The following portions of the chart were reviewed this encounter and updated as appropriate:   Tobacco  Allergies  Meds  Problems  Med Hx  Surg Hx  Fam Hx      Review of Systems:  No other skin or systemic complaints except as noted in HPI or Assessment and Plan.  Objective  Well appearing patient in no apparent distress; mood and affect are within normal limits.  A focused examination was performed including face. Relevant physical exam findings are noted in the Assessment and Plan.  face Rhytides and volume loss.        Assessment & Plan  Elastosis of skin face  Botox 54 units-  Upper lip - 6 units to target fine lines Crows Feet - 24 units, 12 each side Forehead - 5 units Frown Complex - 19 units  Filling material injection - face Location: See attached image  Informed consent: Discussed risks (infection, pain, bleeding, bruising, swelling, allergic reaction, paralysis of nearby muscles, eyelid droop, double vision, neck weakness, difficulty breathing, headache, undesirable cosmetic result, and need for additional treatment) and benefits of the procedure, as well as the alternatives.  Informed consent was obtained.  Preparation: The area was cleansed with alcohol.  Procedure Details:  Botox was injected into the dermis with a 30-gauge needle. Pressure applied to any bleeding. Ice packs offered for swelling.  Lot Number:  [id number] Expiration:  07/2024  Total Units Injected:  54  Plan: Tylenol may be used for headache.  Allow 2 weeks before returning to clinic for additional dosing as needed. Patient will call for any problems.    Return in about 3 months (around 09/09/2022) for Botox, as scheduled for psoriasis follow up.  Graciella Belton, RMA, am acting as scribe for Forest Gleason, MD  .  Documentation: I have reviewed the above documentation for accuracy and completeness, and I agree with the above.  Forest Gleason, MD

## 2022-06-10 NOTE — Telephone Encounter (Signed)
Patient's Skyrizi to be delivered this week to patient to start injections. She c/o itching and is only using hydroxyzine at bedtime and clobetasol at scalp. Advised patient she can increase hydroxyzine to 2 '25mg'$  tablets qhs prn itch. Samples of Zoryve x 2 given to patient to use prn to aa.  Lot # [id number]  Exp: 12/24 Lurlean Horns., RMA

## 2022-06-11 DIAGNOSIS — Z83719 Family history of colon polyps, unspecified: Secondary | ICD-10-CM | POA: Diagnosis not present

## 2022-06-11 DIAGNOSIS — Z86010 Personal history of colonic polyps: Secondary | ICD-10-CM | POA: Diagnosis not present

## 2022-06-11 DIAGNOSIS — R933 Abnormal findings on diagnostic imaging of other parts of digestive tract: Secondary | ICD-10-CM | POA: Diagnosis not present

## 2022-06-11 DIAGNOSIS — K219 Gastro-esophageal reflux disease without esophagitis: Secondary | ICD-10-CM | POA: Diagnosis not present

## 2022-06-13 ENCOUNTER — Encounter: Payer: Self-pay | Admitting: Dermatology

## 2022-06-19 ENCOUNTER — Telehealth: Payer: Self-pay

## 2022-06-19 NOTE — Telephone Encounter (Signed)
Pharmacy faxed refill request for [facility] pen 150 mg/mL, but for loading dose. Since loading dose has already been filled and maintenance dose was sent as well request was denied.

## 2022-06-27 ENCOUNTER — Ambulatory Visit: Payer: BC Managed Care – PPO | Admitting: Dermatology

## 2022-06-30 ENCOUNTER — Other Ambulatory Visit: Payer: Self-pay | Admitting: Dermatology

## 2022-07-01 ENCOUNTER — Telehealth: Payer: Self-pay

## 2022-07-01 NOTE — Telephone Encounter (Signed)
Patient's [employer] PA denied by [facility] due to no clinical documentation of decreased BSA. Patient just started medication and in the middle of loading doses. Appeal will be completed stating information.  Sample in fridge for patient to pick up for 07/10/22 dose. Aw

## 2022-07-01 NOTE — Telephone Encounter (Signed)
Will hold appeal letter until patient is seen in February and clear documentation is in that patient is improving after Skyrizi loading dose. If appeal is filed now and denied, possibility of waiting 6 months until renewing RX.

## 2022-07-09 NOTE — Telephone Encounter (Signed)
Patients husband came in and picked up sample dose of Skyrizi.    [id number] Exp:06/2023

## 2022-08-14 ENCOUNTER — Ambulatory Visit (INDEPENDENT_AMBULATORY_CARE_PROVIDER_SITE_OTHER): Payer: No Typology Code available for payment source

## 2022-08-14 DIAGNOSIS — K2950 Unspecified chronic gastritis without bleeding: Secondary | ICD-10-CM | POA: Diagnosis not present

## 2022-08-14 DIAGNOSIS — Z86010 Personal history of colonic polyps: Secondary | ICD-10-CM | POA: Diagnosis present

## 2022-08-21 ENCOUNTER — Ambulatory Visit: Payer: BC Managed Care – PPO | Admitting: Dermatology

## 2022-09-10 ENCOUNTER — Ambulatory Visit (INDEPENDENT_AMBULATORY_CARE_PROVIDER_SITE_OTHER): Payer: No Typology Code available for payment source | Admitting: Dermatology

## 2022-09-10 ENCOUNTER — Encounter: Payer: Self-pay | Admitting: Dermatology

## 2022-09-10 VITALS — BP 103/73 | HR 66

## 2022-09-10 DIAGNOSIS — L409 Psoriasis, unspecified: Secondary | ICD-10-CM

## 2022-09-10 DIAGNOSIS — L820 Inflamed seborrheic keratosis: Secondary | ICD-10-CM | POA: Diagnosis not present

## 2022-09-10 DIAGNOSIS — L821 Other seborrheic keratosis: Secondary | ICD-10-CM | POA: Diagnosis not present

## 2022-09-10 MED ORDER — CLOBETASOL PROPIONATE 0.05 % EX SOLN: 1.0000 | CUTANEOUS | 2 refills | Status: DC

## 2022-09-10 NOTE — Patient Instructions (Addendum)
Due to recent changes in healthcare laws, you may see results of your pathology and/or laboratory studies on MyChart before the doctors have had a chance to review them. We understand that in some cases there may be results that are confusing or concerning to you. Please understand that not all results are received at the same time and often the doctors may need to interpret multiple results in order to provide you with the best plan of care or course of treatment. Therefore, we ask that you please give us 2 business days to thoroughly review all your results before contacting the office for clarification. Should we see a critical lab result, you will be contacted sooner.   If You Need Anything After Your Visit  If you have any questions or concerns for your doctor, please call our main line at [phone] and press option 4 to reach your doctor's medical assistant. If no one answers, please leave a voicemail as directed and we will return your call as soon as possible. Messages left after 4 pm will be answered the following business day.   You may also send us a message via MyChart. We typically respond to MyChart messages within 1-2 business days.  For prescription refills, please ask your pharmacy to contact our office. Our fax number is [phone].  If you have an urgent issue when the clinic is closed that cannot wait until the next business day, you can page your doctor at the number below.    Please note that while we do our best to be available for urgent issues outside of office hours, we are not available 24/7.   If you have an urgent issue and are unable to reach us, you may choose to seek medical care at your doctor's office, retail clinic, urgent care center, or emergency room.  If you have a medical emergency, please immediately call 911 or go to the emergency department.  Pager Numbers  - Dr. Kowalski: [phone]  - Dr. Moye: [phone]  - Dr. Stewart:  [phone]  In the event of inclement weather, please call our main line at [phone] for an update on the status of any delays or closures.  Dermatology Medication Tips: Please keep the boxes that topical medications come in in order to help keep track of the instructions about where and how to use these. Pharmacies typically print the medication instructions only on the boxes and not directly on the medication tubes.   If your medication is too expensive, please contact our office at [phone] option 4 or send us a message through MyChart.   We are unable to tell what your co-pay for medications will be in advance as this is different depending on your insurance coverage. However, we may be able to find a substitute medication at lower cost or fill out paperwork to get insurance to cover a needed medication.   If a prior authorization is required to get your medication covered by your insurance company, please allow us 1-2 business days to complete this process.  Drug prices often vary depending on where the prescription is filled and some pharmacies may offer cheaper prices.  The website www.goodrx.com contains coupons for medications through different pharmacies. The prices here do not account for what the cost may be with help from insurance (it may be cheaper with your insurance), but the website can give you the price if you did not use any insurance.  - You can print the associated coupon and take it with   your prescription to the pharmacy.  - You may also stop by our office during regular business hours and pick up a GoodRx coupon card.  - If you need your prescription sent electronically to a different pharmacy, notify our office through [place] MyChart or by phone at [phone] option 4.     Si Usted Necesita Algo Despus de Su Visita  Tambin puede enviarnos un mensaje a travs de MyChart. Por lo general respondemos a los mensajes de MyChart en el transcurso de 1 a 2  das hbiles.  Para renovar recetas, por favor pida a su farmacia que se ponga en contacto con nuestra oficina. Nuestro nmero de fax es el [phone].  Si tiene un asunto urgente cuando la clnica est cerrada y que no puede esperar hasta el siguiente da hbil, puede llamar/localizar a su doctor(a) al nmero que aparece a continuacin.   Por favor, tenga en cuenta que aunque hacemos todo lo posible para estar disponibles para asuntos urgentes fuera del horario de oficina, no estamos disponibles las 24 horas del da, los 7 das de la semana.   Si tiene un problema urgente y no puede comunicarse con nosotros, puede optar por buscar atencin mdica  en el consultorio de su doctor(a), en una clnica privada, en un centro de atencin urgente o en una sala de emergencias.  Si tiene una emergencia mdica, por favor llame inmediatamente al 911 o vaya a la sala de emergencias.  Nmeros de bper  - Dr. Kowalski: [phone]  - Dra. Moye: [phone]  - Dra. Stewart: [phone]  En caso de inclemencias del tiempo, por favor llame a nuestra lnea principal al [phone] para una actualizacin sobre el estado de cualquier retraso o cierre.  Consejos para la medicacin en dermatologa: Por favor, guarde las cajas en las que vienen los medicamentos de uso tpico para ayudarle a seguir las instrucciones sobre dnde y cmo usarlos. Las farmacias generalmente imprimen las instrucciones del medicamento slo en las cajas y no directamente en los tubos del medicamento.   Si su medicamento es muy caro, por favor, pngase en contacto con nuestra oficina llamando al [phone] y presione la opcin 4 o envenos un mensaje a travs de MyChart.   No podemos decirle cul ser su copago por los medicamentos por adelantado ya que esto es diferente dependiendo de la cobertura de su seguro. Sin embargo, es posible que podamos encontrar un medicamento sustituto a menor costo o llenar un formulario para que el  seguro cubra el medicamento que se considera necesario.   Si se requiere una autorizacin previa para que su compaa de seguros cubra su medicamento, por favor permtanos de 1 a 2 das hbiles para completar este proceso.  Los precios de los medicamentos varan con frecuencia dependiendo del lugar de dnde se surte la receta y alguna farmacias pueden ofrecer precios ms baratos.  El sitio web www.goodrx.com tiene cupones para medicamentos de diferentes farmacias. Los precios aqu no tienen en cuenta lo que podra costar con la ayuda del seguro (puede ser ms barato con su seguro), pero el sitio web puede darle el precio si no utiliz ningn seguro.  - Puede imprimir el cupn correspondiente y llevarlo con su receta a la farmacia.  - Tambin puede pasar por nuestra oficina durante el horario de atencin regular y recoger una tarjeta de cupones de GoodRx.  - Si necesita que su receta se enve electrnicamente a una farmacia diferente, informe a nuestra oficina a travs de MyChart de [place]   o por telfono llamando al [phone] y presione la opcin 4.  

## 2022-09-10 NOTE — Progress Notes (Addendum)
   Follow-Up Visit   Subjective  Holly Le is a 57 y.o. female who presents for the following: Psoriasis (Psoriasis follow up. Currently on skyrizi. Using clobetasol as needed for flares at scalp. Patient reports a spot on back that is irritated.).  The patient has spots, moles and lesions to be evaluated, some may be new or changing and the patient has concerns that these could be cancer.   The following portions of the chart were reviewed this encounter and updated as appropriate:  Tobacco  Allergies  Meds  Problems  Med Hx  Surg Hx  Fam Hx      Review of Systems: No other skin or systemic complaints except as noted in HPI or Assessment and Plan.   Objective  Well appearing patient in no apparent distress; mood and affect are within normal limits.  A focused examination was performed including face, scalp, neck, back. Relevant physical exam findings are noted in the Assessment and Plan.  Scalp Thin scaly pink scaly plaques at scalp  Right Upper Back Erythematous stuck-on, waxy papule or plaque   Assessment & Plan  Psoriasis Scalp  Chronic and persistent condition with duration or expected duration over one year. Condition is symptomatic/ bothersome to patient. Not currently at goal but with definite improvement since starting [place]. Too soon to see full effect of Skyrizi. Recommend continuing [place].  Continue protopic 0.1 % ointment and clobetasol solution as directed.  Start vtama and zoryve (samples given) once daily to scalp.  Psoriasis is a chronic non-curable, but treatable genetic/hereditary disease that may have other systemic features affecting other organ systems such as joints (Psoriatic Arthritis). It is associated with an increased risk of inflammatory bowel disease, heart disease, non-alcoholic fatty liver disease, and depression.    Denies joint pain  Related Medications hydrOXYzine (ATARAX) 25 MG tablet Take 1 tablet (25 mg total) by mouth  See admin instructions. 1 - 2 times daily as needed for itching. May cause drowsiness  tacrolimus (PROTOPIC) 0.1 % ointment Apply topically 2 (two) times daily.  clobetasol (TEMOVATE) 0.05 % external solution Apply 1 Application topically See admin instructions. Apply topically 2 (two) times daily to scalp for psoriasis. Avoid applying to face, groin, and axilla. Use as directed. Long-term use can cause thinning of the skin.  Inflamed seborrheic keratosis Right Upper Back  Symptomatic, irritating, patient deferred treatment, will treat with clobetasol solution. Avoid applying to face, groin, and axilla. Use as directed. Long-term use can cause thinning of the skin.  Topical steroids (such as triamcinolone, fluocinolone, fluocinonide, mometasone, clobetasol, halobetasol, betamethasone, hydrocortisone) can cause thinning and lightening of the skin if they are used for too long in the same area. Your physician has selected the right strength medicine for your problem and area affected on the body. Please use your medication only as directed by your physician to prevent side effects.       Seborrheic Keratoses - Stuck-on, waxy, tan-brown papules and/or plaques  - Benign-appearing - Discussed benign etiology and prognosis. - Observe - Call for any changes  Return for psoriasis follow up in june .  I, Ruthell Rummage, CMA, am acting as scribe for Forest Gleason, MD.  Documentation: I have reviewed the above documentation for accuracy and completeness, and I agree with the above.  Forest Gleason, MD

## 2022-09-17 NOTE — Addendum Note (Signed)
Addended by: Alfonso Patten on: 09/17/2022 09:38 PM   Modules accepted: Orders

## 2022-10-01 ENCOUNTER — Telehealth: Payer: Self-pay

## 2022-10-01 NOTE — Telephone Encounter (Signed)
Patient picked up [facility] sample.  LOT: [id number]  EXP: Jan 2025

## 2022-10-10 ENCOUNTER — Ambulatory Visit (INDEPENDENT_AMBULATORY_CARE_PROVIDER_SITE_OTHER): Payer: Self-pay | Admitting: Dermatology

## 2022-10-10 ENCOUNTER — Encounter: Payer: Self-pay | Admitting: Dermatology

## 2022-10-10 VITALS — BP 103/73

## 2022-10-10 DIAGNOSIS — L988 Other specified disorders of the skin and subcutaneous tissue: Secondary | ICD-10-CM

## 2022-10-10 NOTE — Patient Instructions (Addendum)
Recommend Arnica gel to help with bruising.  Due to recent changes in healthcare laws, you may see results of your pathology and/or laboratory studies on MyChart before the doctors have had a chance to review them. We understand that in some cases there may be results that are confusing or concerning to you. Please understand that not all results are received at the same time and often the doctors may need to interpret multiple results in order to provide you with the best plan of care or course of treatment. Therefore, we ask that you please give [place] 2 business days to thoroughly review all your results before contacting the office for clarification. Should we see a critical lab result, you will be contacted sooner.   If You Need Anything After Your Visit  If you have any questions or concerns for your doctor, please call our main line at [phone] and press option 4 to reach your doctor's medical assistant. If no one answers, please leave a voicemail as directed and we will return your call as soon as possible. Messages left after 4 pm will be answered the following business day.   You may also send [place] a message via MyChart. We typically respond to MyChart messages within 1-2 business days.  For prescription refills, please ask your pharmacy to contact our office. Our fax number is [phone].  If you have an urgent issue when the clinic is closed that cannot wait until the next business day, you can page your doctor at the number below.    Please note that while we do our best to be available for urgent issues outside of office hours, we are not available 24/7.   If you have an urgent issue and are unable to reach [place], you may choose to seek medical care at your doctor's office, retail clinic, urgent care center, or emergency room.  If you have a medical emergency, please immediately call 911 or go to the emergency department.  Pager Numbers  - Dr. Gwen Pounds: [phone]  - Dr. Neale Burly:  [phone]  - Dr. Roseanne Reno: [phone]  In the event of inclement weather, please call our main line at [phone] for an update on the status of any delays or closures.  Dermatology Medication Tips: Please keep the boxes that topical medications come in in order to help keep track of the instructions about where and how to use these. Pharmacies typically print the medication instructions only on the boxes and not directly on the medication tubes.   If your medication is too expensive, please contact our office at [phone] option 4 or send [place] a message through MyChart.   We are unable to tell what your co-pay for medications will be in advance as this is different depending on your insurance coverage. However, we may be able to find a substitute medication at lower cost or fill out paperwork to get insurance to cover a needed medication.   If a prior authorization is required to get your medication covered by your insurance company, please allow [place] 1-2 business days to complete this process.  Drug prices often vary depending on where the prescription is filled and some pharmacies may offer cheaper prices.  The website www.goodrx.com contains coupons for medications through different pharmacies. The prices here do not account for what the cost may be with help from insurance (it may be cheaper with your insurance), but the website can give you the price if you did not use any insurance.  - You can  print the associated coupon and take it with your prescription to the pharmacy.  - You may also stop by our office during regular business hours and pick up a GoodRx coupon card.  - If you need your prescription sent electronically to a different pharmacy, notify our office through [facility] or by phone at [phone] option 4.     Si Usted Necesita Algo Despus de Su Visita  Tambin puede enviarnos un mensaje a travs de Pharmacist, community. Por lo general respondemos a los mensajes de  MyChart en el transcurso de 1 a 2 das hbiles.  Para renovar recetas, por favor pida a su farmacia que se ponga en contacto con nuestra oficina. Harland Dingwall de fax es [place] [phone].  Si tiene un asunto urgente cuando la clnica est cerrada y que no puede esperar hasta el siguiente da hbil, puede llamar/localizar a su doctor(a) al nmero que aparece a continuacin.   Por favor, tenga en cuenta que aunque hacemos todo lo posible para estar disponibles para asuntos urgentes fuera del horario de [place], no estamos disponibles las 24 horas del da, los 7 das de la [place].   Si tiene un problema urgente y no puede comunicarse con nosotros, puede optar por buscar atencin mdica  en el consultorio de su doctor(a), en una clnica privada, en un centro de atencin urgente o en una sala de emergencias.  Si tiene Engineering geologist, por favor llame inmediatamente al 911 o vaya a la sala de emergencias.  Nmeros de bper  - Dr. Nehemiah Massed: [phone]  - Dra. Moye: [phone]  - Dra. Nicole Kindred: [phone]  En caso de inclemencias del [place], por favor llame a Johnsie Kindred principal al [phone] para una actualizacin sobre el [place] de cualquier retraso o cierre.  Consejos para la medicacin en dermatologa: Por favor, guarde las cajas en las que vienen los medicamentos de uso tpico para ayudarle a seguir las instrucciones sobre dnde y cmo usarlos. Las farmacias generalmente imprimen las instrucciones del medicamento slo en las cajas y no directamente en los tubos del [place].   Si su medicamento es muy caro, por favor, pngase en contacto con Zigmund Daniel llamando al [phone] y presione la opcin 4 o envenos un mensaje a travs de Pharmacist, community.   No podemos decirle cul ser su copago por los medicamentos por adelantado ya que esto es diferente dependiendo de la cobertura de su seguro. Sin embargo, es posible que podamos encontrar un medicamento sustituto a Contractor un formulario para que el seguro cubra el medicamento que se considera necesario.   Si se requiere una autorizacin previa para que su compaa de seguros [place] su medicamento, por favor permtanos de 1 a 2 das hbiles para completar este proceso.  Los precios de los medicamentos varan con frecuencia dependiendo del Environmental consultant de dnde se surte la receta y alguna farmacias pueden ofrecer precios ms baratos.  El sitio web www.goodrx.com tiene cupones para medicamentos de Airline pilot. Los precios aqu no tienen en cuenta lo que podra costar con la ayuda del seguro (puede ser ms barato con su seguro), pero el sitio web puede darle el precio si no utiliz Research scientist (physical sciences).  - Puede imprimir el cupn correspondiente y llevarlo con su receta a la farmacia.  - Tambin puede pasar por nuestra oficina durante el horario de atencin regular y Charity fundraiser una tarjeta de cupones de GoodRx.  - Si necesita que su receta se enve electrnicamente a una farmacia diferente, informe a [place]  oficina a travs de MyChart [place] o por telfono llamando al [phone] y presione la opcin 4.

## 2022-10-10 NOTE — Progress Notes (Signed)
   Follow-Up Visit   Subjective  Holly Le is a 57 y.o. female who presents for the following: Botox for facial elastosis  The following portions of the chart were reviewed this encounter and updated as appropriate: medications, allergies, medical history  Review of Systems:  No other skin or systemic complaints except as noted in HPI or Assessment and Plan.  Objective  Well appearing patient in no apparent distress; mood and affect are within normal limits.  A focused examination was performed of the face.  Relevant physical exam findings are noted in the Assessment and Plan.  Before botox photos if first time, Injection map photo    Assessment & Plan    Facial Elastosis  Location: See attached image  Informed consent: Discussed risks (infection, pain, bleeding, bruising, swelling, allergic reaction, paralysis of nearby muscles, eyelid droop, double vision, neck weakness, difficulty breathing, headache, undesirable cosmetic result, and need for additional treatment) and benefits of the procedure, as well as the alternatives.  Informed consent was obtained.  Preparation: The area was cleansed with alcohol.  Procedure Details:  Botox was injected into the dermis with a 30-gauge needle. Pressure applied to any bleeding. Ice packs offered for swelling.  Lot Number:  [id number] Expiration:  09/2024  Total Units Injected:  54  Plan: Tylenol may be used for headache.  Allow 2 weeks before returning to clinic for additional dosing as needed. Patient will call for any problems.  Return for as scheduled.  Anise Salvo, RMA, am acting as scribe for Darden Dates, MD .   Documentation: I have reviewed the above documentation for accuracy and completeness, and I agree with the above.  Darden Dates, MD

## 2022-10-24 ENCOUNTER — Other Ambulatory Visit: Payer: Self-pay | Admitting: Nurse Practitioner

## 2022-10-24 DIAGNOSIS — F411 Generalized anxiety disorder: Secondary | ICD-10-CM

## 2022-12-12 ENCOUNTER — Ambulatory Visit: Payer: No Typology Code available for payment source | Admitting: Dermatology

## 2023-02-17 ENCOUNTER — Other Ambulatory Visit: Payer: Self-pay

## 2023-02-17 ENCOUNTER — Encounter: Payer: Self-pay | Admitting: Dermatology

## 2023-02-17 MED ORDER — SKYRIZI PEN 150 MG/ML ~~LOC~~ SOAJ: 150.0000 mg | SUBCUTANEOUS | 1 refills | Status: DC

## 2023-02-25 ENCOUNTER — Ambulatory Visit: Payer: No Typology Code available for payment source | Admitting: Dermatology

## 2023-03-04 ENCOUNTER — Ambulatory Visit: Payer: No Typology Code available for payment source | Admitting: Dermatology

## 2023-03-04 ENCOUNTER — Encounter: Payer: Self-pay | Admitting: Dermatology

## 2023-03-04 VITALS — BP 99/67 | HR 69

## 2023-03-04 DIAGNOSIS — Z79899 Other long term (current) drug therapy: Secondary | ICD-10-CM | POA: Diagnosis not present

## 2023-03-04 DIAGNOSIS — Z7189 Other specified counseling: Secondary | ICD-10-CM

## 2023-03-04 DIAGNOSIS — L409 Psoriasis, unspecified: Secondary | ICD-10-CM

## 2023-03-04 NOTE — Progress Notes (Signed)
Follow-Up Visit   Subjective  Holly Le is a 57 y.o. female who presents for the following: Psoriasis. Scalp. Has been on Skyrizi since December 2023. Needs sample of Skyrizi today. Is still having trouble with [employer] shipments. Was due for injection in July, is beginning to flare. Controlled well while on Skyrizi as directed. Using Protopic with this flare.   The following portions of the chart were reviewed this encounter and updated as appropriate: medications, allergies, medical history  Review of Systems:  No other skin or systemic complaints except as noted in HPI or Assessment and Plan.  Objective  Well appearing patient in no apparent distress; mood and affect are within normal limits.  Areas Examined: Scalp  Relevant exam findings are noted in the Assessment and Plan.      Assessment & Plan   Psoriasis  Related Procedures QuantiFERON-TB Gold Plus  Related Medications hydrOXYzine (ATARAX) 25 MG tablet Take 1 tablet (25 mg total) by mouth See admin instructions. 1 - 2 times daily as needed for itching. May cause drowsiness  tacrolimus (PROTOPIC) 0.1 % ointment Apply topically 2 (two) times daily.  clobetasol (TEMOVATE) 0.05 % external solution Apply 1 Application topically See admin instructions. Apply topically 2 (two) times daily to scalp for psoriasis. Avoid applying to face, groin, and axilla. Use as directed. Long-term use can cause thinning of the skin.  Encounter for long-term (current) use of high-risk medication  Related Procedures QuantiFERON-TB Gold Plus    PSORIASIS Well-demarcated erythematous plaques on vertex and postauricular scalp 1% BSA. Patient also reports nipple lesions, defers exam of this area  Chronic and persistent condition with duration or expected duration over one year. Condition is bothersome/symptomatic for patient. Currently flared.   Counseling and coordination of care for severe psoriasis on systemic treatment   Psoriasis - severe on systemic treatment.  Psoriasis is a chronic non-curable, but treatable genetic/hereditary disease that may have other systemic features affecting other organ systems such as joints (Psoriatic Arthritis).  It is linked with heart disease, inflammatory bowel disease, non-alcoholic fatty liver disease, and depression. Significant skin psoriasis and/or psoriatic arthritis may have significant symptoms and affects activities of daily activity and often benefits from systemic treatments.  These systemic treatments have some potential side effects including immunosuppression and require pre-treatment laboratory screening and periodic laboratory monitoring and periodic in person evaluation and monitoring by the attending dermatologist physician (long term medication management).   Patient denies joint pain  Treatment Plan: Continue Syrizi 150 mg every 3 months  Sample given today. Patient will inject herself at home. Lot: [id number] Exp: 03/2024  Quant gold ordered, patient will get it done at PCP or in early November  Reviewed risks of biologics including immunosuppression, infections, injection site reaction, and failure to improve condition. Goal is control of skin condition, not cure.  Some older biologics such as Humira and Enbrel may slightly increase risk of malignancy and may worsen congestive heart failure.  Taltz and Cosentyx may cause inflammatory bowel disease to flare. The use of biologics requires long term medication management, including periodic office visits and monitoring of blood work.   Long term medication management.  Patient is using long term (months to years) prescription medication  to control their dermatologic condition.  These medications require periodic monitoring to evaluate for efficacy and side effects and may require periodic laboratory monitoring.   Return in about 1 year (around 03/03/2024) for Psoriasis Follow Up.  I, Lawson Radar, CMA, am acting  as scribe  for Elie Goody, MD.   Documentation: I have reviewed the above documentation for accuracy and completeness, and I agree with the above.  Elie Goody, MD

## 2023-03-04 NOTE — Patient Instructions (Addendum)
Continue Syrizi 150 mg every 3 months   Reviewed risks of biologics including immunosuppression, infections, injection site reaction, and failure to improve condition. Goal is control of skin condition, not cure.  Some older biologics such as Humira and Enbrel may slightly increase risk of malignancy and may worsen congestive heart failure.  Taltz and Cosentyx may cause inflammatory bowel disease to flare. The use of biologics requires long term medication management, including periodic office visits and monitoring of blood work.   Due to recent changes in healthcare laws, you may see results of your pathology and/or laboratory studies on MyChart before the doctors have had a chance to review them. We understand that in some cases there may be results that are confusing or concerning to you. Please understand that not all results are received at the same time and often the doctors may need to interpret multiple results in order to provide you with the best plan of care or course of treatment. Therefore, we ask that you please give [place] 2 business days to thoroughly review all your results before contacting the office for clarification. Should we see a critical lab result, you will be contacted sooner.   If You Need Anything After Your Visit  If you have any questions or concerns for your doctor, please call our main line at [phone] and press option 4 to reach your doctor's medical assistant. If no one answers, please leave a voicemail as directed and we will return your call as soon as possible. Messages left after 4 pm will be answered the following business day.   You may also send [place] a message via MyChart. We typically respond to MyChart messages within 1-2 business days.  For prescription refills, please ask your pharmacy to contact our office. Our fax number is [phone].  If you have an urgent issue when the clinic is closed that cannot wait until the next business day, you can page your  doctor at the number below.    Please note that while we do our best to be available for urgent issues outside of office hours, we are not available 24/7.   If you have an urgent issue and are unable to reach [place], you may choose to seek medical care at your doctor's office, retail clinic, urgent care center, or emergency room.  If you have a medical emergency, please immediately call 911 or go to the emergency department.  Pager Numbers  - Dr. Gwen Pounds: [phone]  - Dr. Roseanne Reno: [phone]  - Dr. Katrinka Blazing: [phone]   In the event of inclement weather, please call our main line at [phone] for an update on the status of any delays or closures.  Dermatology Medication Tips: Please keep the boxes that topical medications come in in order to help keep track of the instructions about where and how to use these. Pharmacies typically print the medication instructions only on the boxes and not directly on the medication tubes.   If your medication is too expensive, please contact our office at [phone] option 4 or send [place] a message through MyChart.   We are unable to tell what your co-pay for medications will be in advance as this is different depending on your insurance coverage. However, we may be able to find a substitute medication at lower cost or fill out paperwork to get insurance to cover a needed medication.   If a prior authorization is required to get your medication covered by your insurance company, please allow [place] 1-2 business  days to complete this process.  Drug prices often vary depending on where the prescription is filled and some pharmacies may offer cheaper prices.  The website www.goodrx.com contains coupons for medications through different pharmacies. The prices here do not account for what the cost may be with help from insurance (it may be cheaper with your insurance), but the website can give you the price if you did not use any insurance.  - You can print  the associated coupon and take it with your prescription to the pharmacy.  - You may also stop by our office during regular business hours and pick up a GoodRx coupon card.  - If you need your prescription sent electronically to a different pharmacy, notify our office through [facility] or by phone at [phone] option 4.     Si Usted Necesita Algo Despus de Su Visita  Tambin puede enviarnos un mensaje a travs de Clinical cytogeneticist. Por lo general respondemos a los mensajes de MyChart en el transcurso de 1 a 2 das hbiles.  Para renovar recetas, por favor pida a su farmacia que se ponga en contacto con nuestra oficina. Annie Sable de fax es [place] [phone].  Si tiene un asunto urgente cuando la clnica est cerrada y que no puede esperar hasta el siguiente da hbil, puede llamar/localizar a su doctor(a) al nmero que aparece a continuacin.   Por favor, tenga en cuenta que aunque hacemos todo lo posible para estar disponibles para asuntos urgentes fuera del horario de [place], no estamos disponibles las 24 horas del da, los 7 [address]  [address] de la [place].   Si tiene un problema urgente y no puede comunicarse con nosotros, puede optar por buscar atencin mdica  en el consultorio de su doctor(a), en una clnica privada, en un centro de atencin urgente o en una sala de emergencias.  Si tiene Engineer, drilling, por favor llame inmediatamente al 911 o vaya a la sala de emergencias.  Nmeros de bper  - Dr. Gwen Pounds: [phone]  - Dra. Roseanne Reno: [phone]  - Dr. Katrinka Blazing: [phone]   En caso de inclemencias del tiempo, por favor llame a Lacy Duverney principal al [phone] para una actualizacin sobre el [place] de cualquier retraso o cierre.  Consejos para la medicacin en dermatologa: Por favor, guarde las cajas en las que vienen los medicamentos de uso tpico para ayudarle a seguir las instrucciones sobre dnde y cmo usarlos. Las farmacias generalmente imprimen las  instrucciones del medicamento slo en las cajas y no directamente en los tubos del [place].   Si su medicamento es muy caro, por favor, pngase en contacto con Rolm Gala llamando al [phone] y presione la opcin 4 o envenos un mensaje a travs de Clinical cytogeneticist.   No podemos decirle cul ser su copago por los medicamentos por adelantado ya que esto es diferente dependiendo de la cobertura de su seguro. Sin embargo, es posible que podamos encontrar un medicamento sustituto a Audiological scientist un formulario para que el seguro cubra el medicamento que se considera necesario.   Si se requiere una autorizacin previa para que su compaa de seguros [place] su medicamento, por favor permtanos de 1 a 2 das hbiles para completar [address]  Los precios de los medicamentos varan con frecuencia dependiendo del Environmental consultant de dnde se surte la receta y alguna farmacias pueden ofrecer precios ms baratos.  El sitio web www.goodrx.com tiene cupones para medicamentos de Health and safety inspector. Los precios aqu no tienen en cuenta lo que podra costar con  la ayuda del seguro (puede ser ms barato con su seguro), pero el sitio web puede darle el precio si no Visual merchandiser.  - Puede imprimir el cupn correspondiente y llevarlo con su receta a la farmacia.  - Tambin puede pasar por nuestra oficina durante el horario de atencin regular y Education officer, museum una tarjeta de cupones de GoodRx.  - Si necesita que su receta se enve electrnicamente a una farmacia diferente, informe a nuestra oficina a travs de MyChart de [place] o por telfono llamando al [phone] y presione la opcin 4.

## 2023-04-08 ENCOUNTER — Ambulatory Visit: Payer: No Typology Code available for payment source | Admitting: Dermatology

## 2023-04-22 NOTE — Telephone Encounter (Signed)
Deb with Denyse Amass, an escalation specialist has reached out to [place]. Patient has a Licensed conveyancer with her insurance giving her such high copays. Reece Levy is going to explain all the information and go over it in detail with it, once she returns her call.   Debs direct line: [phone]

## 2023-04-27 ENCOUNTER — Encounter: Payer: Self-pay | Admitting: Dermatology

## 2023-06-19 ENCOUNTER — Encounter: Payer: Self-pay | Admitting: Dermatology

## 2023-08-26 ENCOUNTER — Other Ambulatory Visit: Payer: Self-pay | Admitting: Internal Medicine

## 2023-08-26 DIAGNOSIS — Z1231 Encounter for screening mammogram for malignant neoplasm of breast: Secondary | ICD-10-CM

## 2023-10-09 ENCOUNTER — Encounter: Payer: Self-pay | Admitting: Dermatology

## 2023-10-16 ENCOUNTER — Telehealth: Payer: Self-pay

## 2023-10-16 NOTE — Telephone Encounter (Signed)
 Patient came and picked up sample of Skyrizi  150 mg/mL to self-inject. Lot # [id number]  Exp: 07/2024 Sue Em., RMA

## 2023-11-04 ENCOUNTER — Encounter: Payer: Self-pay | Admitting: Dermatology

## 2023-11-04 ENCOUNTER — Other Ambulatory Visit: Payer: Self-pay | Admitting: Dermatology

## 2023-11-04 ENCOUNTER — Ambulatory Visit: Admitting: Dermatology

## 2023-11-04 DIAGNOSIS — Z7189 Other specified counseling: Secondary | ICD-10-CM

## 2023-11-04 DIAGNOSIS — Z79899 Other long term (current) drug therapy: Secondary | ICD-10-CM

## 2023-11-04 DIAGNOSIS — L409 Psoriasis, unspecified: Secondary | ICD-10-CM | POA: Diagnosis not present

## 2023-11-04 NOTE — Progress Notes (Signed)
   Follow-Up Visit   Subjective  Holly Le is a 58 y.o. female who presents for the following: Discussing other options for psoriasis. Patient would like to stay on Skyrizzi injections however it is too expensive. Patient states this medication has helped her scalp significantly and psoriasis has been well controlled with injections. Last injection was around when patient picked up sample on 10/16/2023.  The patient has spots, moles and lesions to be evaluated, some may be new or changing and the patient may have concern these could be cancer.   The following portions of the chart were reviewed this encounter and updated as appropriate: medications, allergies, medical history  Review of Systems:  No other skin or systemic complaints except as noted in HPI or Assessment and Plan.  Objective  Well appearing patient in no apparent distress; mood and affect are within normal limits.  A focused examination was performed of the following areas: Scalp  Relevant exam findings are noted in the Assessment and Plan.    Assessment & Plan   PSORIASIS- failed Otezla  (cleared body but not scalp), clobetasol  foam (insufficient), Cosentyx (severe side effect). Clear on Skyrizi  but cost prohibitive Exam: clear on clothed exam. Patient reports no lesions  Patient has tried and failed Cosentyx in the past and had a bad reaction to Consentyx, side effects were hematochezia and esophagitis. Added it to allergy list  Chronic condition with duration or expected duration over one year. Currently well-controlled on Skyrizi  injections.   Patient denies joint pain.   Psoriasis is a chronic non-curable, but treatable genetic/hereditary disease that may have other systemic features affecting other organ systems such as joints (Psoriatic Arthritis). It is associated with an increased risk of inflammatory bowel disease, heart disease, non-alcoholic fatty liver disease, and depression.  Treatments include  light and laser treatments; topical medications; and systemic medications including oral and injectables.  Treatment Plan: Quant gold ordered today. Patient wants to stay on Skyrizi  but copay is $1300. This is due to patient's insurance plan requiring deductible and exhaustion of $4000 copay card. Patient may quality for reimbursement of cost. Mylinda Asa spoke to drug rep and will call. The cost will likely be true of all biologics Discussed Tremfya and Stelara injections as potential alternatives if cheaper but likely to have the same cost.  Discussed Bimzelx company assistance but severe reaction to Cosentyx means avoiding this class of medications is ideal Continue Skyrizi  150 mg every 12 weeks. Last dose 10/16/23 sample obtained from our clinic  PSORIASIS   Related Medications hydrOXYzine  (ATARAX ) 25 MG tablet Take 1 tablet (25 mg total) by mouth See admin instructions. 1 - 2 times daily as needed for itching. May cause drowsiness tacrolimus  (PROTOPIC ) 0.1 % ointment Apply topically 2 (two) times daily. clobetasol  (TEMOVATE ) 0.05 % external solution Apply 1 Application topically See admin instructions. Apply topically 2 (two) times daily to scalp for psoriasis. Avoid applying to face, groin, and axilla. Use as directed. Long-term use can cause thinning of the skin. ENCOUNTER FOR LONG-TERM (CURRENT) USE OF HIGH-RISK MEDICATION   COUNSELING AND COORDINATION OF CARE   Related Procedures QuantiFERON-TB Gold Plus  Return in about 1 year (around 11/03/2024) for Psoriasis, w/ Dr. Felipe Horton.  I, Jacquelynn Vera, CMA, am acting as scribe for Harris Liming, MD .   Documentation: I have reviewed the above documentation for accuracy and completeness, and I agree with the above.  Harris Liming, MD

## 2023-11-04 NOTE — Patient Instructions (Signed)

## 2023-11-07 LAB — QUANTIFERON-TB GOLD PLUS
QuantiFERON Mitogen Value: >10 [IU]/mL
QuantiFERON Nil Value: 0.03 [IU]/mL
QuantiFERON TB1 Ag Value: 0.03 [IU]/mL
QuantiFERON TB2 Ag Value: 0.04 [IU]/mL
QuantiFERON-TB Gold Plus: NEGATIVE

## 2023-11-09 ENCOUNTER — Ambulatory Visit: Payer: Self-pay | Admitting: Dermatology

## 2024-02-24 ENCOUNTER — Encounter: Payer: Self-pay | Admitting: Dermatology

## 2024-03-04 ENCOUNTER — Ambulatory Visit: Payer: No Typology Code available for payment source | Admitting: Dermatology

## 2024-03-04 ENCOUNTER — Other Ambulatory Visit: Payer: Self-pay

## 2024-03-04 ENCOUNTER — Encounter: Payer: Self-pay | Admitting: Dermatology

## 2024-03-04 DIAGNOSIS — L409 Psoriasis, unspecified: Secondary | ICD-10-CM

## 2024-03-04 DIAGNOSIS — Z79899 Other long term (current) drug therapy: Secondary | ICD-10-CM

## 2024-03-04 DIAGNOSIS — Z7189 Other specified counseling: Secondary | ICD-10-CM

## 2024-03-04 MED ORDER — SKYRIZI PEN 150 MG/ML ~~LOC~~ SOAJ: 150.0000 mg | SUBCUTANEOUS | 3 refills | Status: DC

## 2024-03-04 NOTE — Patient Instructions (Signed)

## 2024-03-04 NOTE — Progress Notes (Signed)
 Follow-Up Visit   Subjective  Holly Le is a 58 y.o. female who presents for the following: Psoriasis f/u, Skyrizi  sq injections last injection 10/2023, pt is flaring currently using Tacrolimus  0.1% oint and Clobetasol  sol to scalp, pt would like a sample until she can get her prescriptions delivered.   The following portions of the chart were reviewed this encounter and updated as appropriate: medications, allergies, medical history  Review of Systems:  No other skin or systemic complaints except as noted in HPI or Assessment and Plan.  Objective  Well appearing patient in no apparent distress; mood and affect are within normal limits.   A focused examination was performed of the following areas: Face, scalp, arms, legs  Relevant exam findings are noted in the Assessment and Plan.    Assessment & Plan   PSORIASIS on Systemic Treatment failed Otezla  (cleared body but not scalp), clobetasol  foam (insufficient), Cosentyx (severe side effect). Clear on Skyrizi  but cost prohibitive  Breast, axilla, scalp Exam: scalp clear but patient feels pruritic prodrome. No lesions on elbows knees ears 0% BSA on Skyrizi .  Chronic and persistent condition with duration or expected duration over one year. Condition is symptomatic/ bothersome to patient. Not currently at goal.  Patient denies joint pain.   Labs QuantiFERON-TB Gold 11/04/2023  Psoriasis - severe on systemic treatment.  Psoriasis is a chronic non-curable, but treatable genetic/hereditary disease that may have other systemic features affecting other organ systems such as joints (Psoriatic Arthritis).  It is linked with heart disease, inflammatory bowel disease, non-alcoholic fatty liver disease, and depression. Significant skin psoriasis and/or psoriatic arthritis may have significant symptoms and affects activities of daily activity and often benefits from systemic treatments.  These systemic treatments have some potential  side effects including immunosuppression and require pre-treatment laboratory screening and periodic laboratory monitoring and periodic in person evaluation and monitoring by the attending dermatologist physician (long term medication management).    Treatment Plan: Cont Tacrolimus  0.1% oint qd/bid prn flares Cont Clobetasol  sol qd aa scalp prn flares Restart Skyrizi  150mg /ml sq injections q 12 wks, sample x 1 given, Lot [id number], exp 01/2025  Topical steroids (such as triamcinolone, fluocinolone, fluocinonide, mometasone, clobetasol , halobetasol, betamethasone , hydrocortisone) can cause thinning and lightening of the skin if they are used for too long in the same area. Your physician has selected the right strength medicine for your problem and area affected on the body. Please use your medication only as directed by your physician to prevent side effects.   Reviewed risks of biologics including immunosuppression, infections (i.e. TB reactivation), injection site reaction, and failure to improve condition. Goal is control of skin condition, not cure.  Some older biologics such as Humira and Enbrel may slightly increase risk of malignancy and may worsen congestive heart failure.  Taltz, Cosentyx, and Bimzelx may cause inflammatory bowel disease to flare or may increase incidence of yeast infections. Skyrizi , Tremfya, and Stelara may also slightly increase risk of infection. The use of biologics requires long term medication management, including periodic office visits, annual TB screening test and monitoring of blood work.  Monitoring for Tuberculosis Infection:  Patient is currently receiving biologic therapy for psoriasis, which carries a potential risk for reactivation of tuberculosis infection. As part of ongoing safety monitoring, a QuantiFERON-TB Gold (QFT-G) test is checked prior to initiation of biologic therapy and yearly per guidelines. - Most recent QFT-G result:     Latest Ref Rng & Units  11/04/2023   11:33 AM  Quantiferon  TB Gold  Quantiferon TB Gold Plus Negative Negative     - Will recheck yearly  - No signs or symptoms of active TB noted. - Will continue to monitor per standard biologic safety protocol. - If QFT-G becomes positive or patient develops symptoms suggestive of TB, appropriate evaluation will be initiated  Long term medication management.  Patient is using long term (months to years) prescription medication  to control their dermatologic condition.  These medications require periodic monitoring to evaluate for efficacy and side effects and may require periodic laboratory monitoring.   PSORIASIS   Related Medications hydrOXYzine  (ATARAX ) 25 MG tablet Take 1 tablet (25 mg total) by mouth See admin instructions. 1 - 2 times daily as needed for itching. May cause drowsiness tacrolimus  (PROTOPIC ) 0.1 % ointment Apply topically 2 (two) times daily. clobetasol  (TEMOVATE ) 0.05 % external solution Apply 1 Application topically See admin instructions. Apply topically 2 (two) times daily to scalp for psoriasis. Avoid applying to face, groin, and axilla. Use as directed. Long-term use can cause thinning of the skin. COUNSELING AND COORDINATION OF CARE   MEDICATION MANAGEMENT   LONG-TERM USE OF HIGH-RISK MEDICATION    Return for as scheduled Psoriasis f/u.  I, Grayce Saunas, RMA, am acting as scribe for Boneta Sharps, MD .   Documentation: I have reviewed the above documentation for accuracy and completeness, and I agree with the above.  Boneta Sharps, MD

## 2024-05-05 ENCOUNTER — Ambulatory Visit
Admission: RE | Admit: 2024-05-05 | Discharge: 2024-05-05 | Disposition: A | Discharge status: Home / Self Care | Source: Ambulatory Visit | Attending: Internal Medicine | Admitting: Internal Medicine

## 2024-05-05 DIAGNOSIS — Z1231 Encounter for screening mammogram for malignant neoplasm of breast: Secondary | ICD-10-CM | POA: Insufficient documentation

## 2024-06-07 ENCOUNTER — Encounter: Payer: Self-pay | Admitting: Dermatology

## 2024-06-08 ENCOUNTER — Other Ambulatory Visit: Payer: Self-pay | Admitting: Dermatology

## 2024-06-08 DIAGNOSIS — L409 Psoriasis, unspecified: Secondary | ICD-10-CM

## 2024-06-08 MED ORDER — CLOBETASOL PROPIONATE 0.05 % EX SOLN: 1.0000 | CUTANEOUS | 11 refills | Status: AC

## 2024-06-22 ENCOUNTER — Other Ambulatory Visit: Payer: Self-pay

## 2024-06-22 MED ORDER — SKYRIZI PEN 150 MG/ML ~~LOC~~ SOAJ: 150.0000 mg | SUBCUTANEOUS | 3 refills | Status: AC

## 2024-06-22 NOTE — Progress Notes (Signed)
 Escripted to [employer].

## 2024-11-04 ENCOUNTER — Ambulatory Visit: Admitting: Dermatology
